# Patient Record
Sex: Male | Born: 1960 | Race: Black or African American | Hispanic: No | Marital: Married | Smoking: Never smoker
Health system: Southern US, Community
[De-identification: ages and names within clinical notes are randomized; demographics above are authoritative.]

## PROBLEM LIST (undated history)

## (undated) DIAGNOSIS — I10 Essential (primary) hypertension: Secondary | ICD-10-CM

## (undated) DIAGNOSIS — E785 Hyperlipidemia, unspecified: Secondary | ICD-10-CM

## (undated) HISTORY — DX: Essential (primary) hypertension: I10

## (undated) HISTORY — DX: Hyperlipidemia, unspecified: E78.5

---

## 2008-01-26 HISTORY — PX: KNEE SURGERY: SHX244

## 2008-04-06 ENCOUNTER — Ambulatory Visit: Payer: Self-pay | Admitting: Family Medicine

## 2008-06-13 ENCOUNTER — Ambulatory Visit: Payer: Self-pay | Admitting: Specialist

## 2008-08-28 ENCOUNTER — Emergency Department: Payer: Self-pay | Admitting: Emergency Medicine

## 2009-10-21 IMAGING — CT CT HEAD WITHOUT CONTRAST
2 series · 16 of 30 positions shown, 20 images · non-contrast
Comparison: none

REASON FOR EXAM: HA
COMMENTS:

[Series 2: without · axial · non-contrast · 0.45mm/px · z∈[+413,+538]mm · 13 of 31 slices shown, 17 images]
[im 3/31  brain]
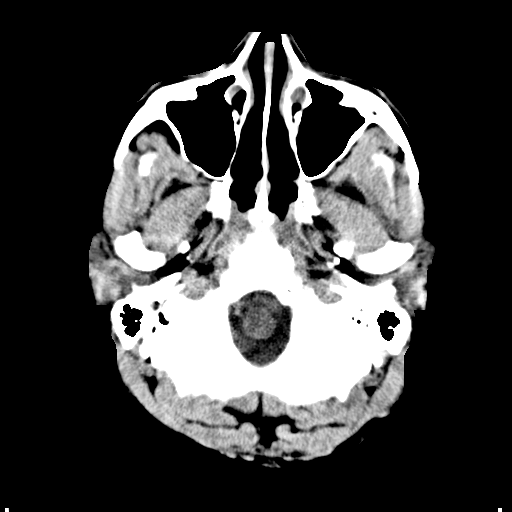
[im 3/31  bone]
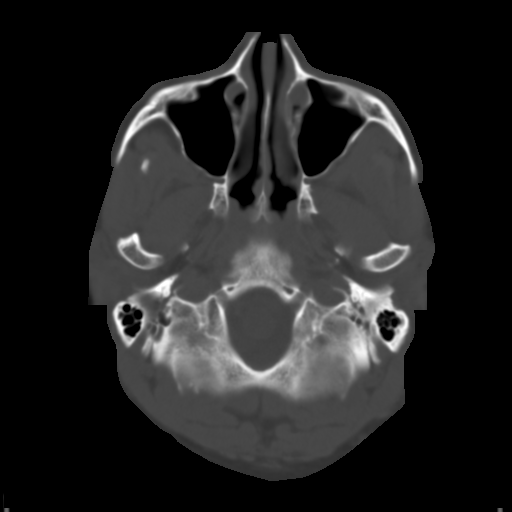
[im 5/31  brain]
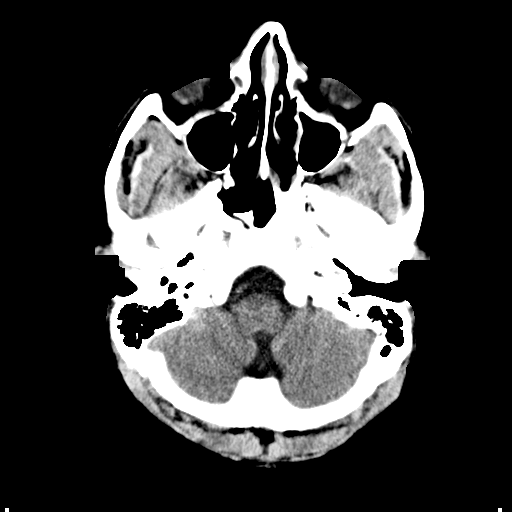
[im 7/31  brain]
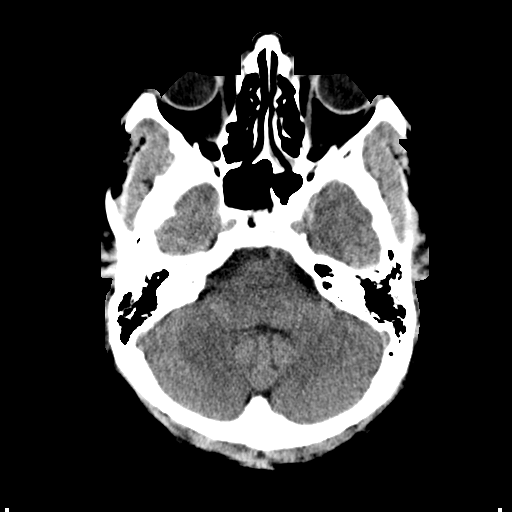
[im 9/31  brain]
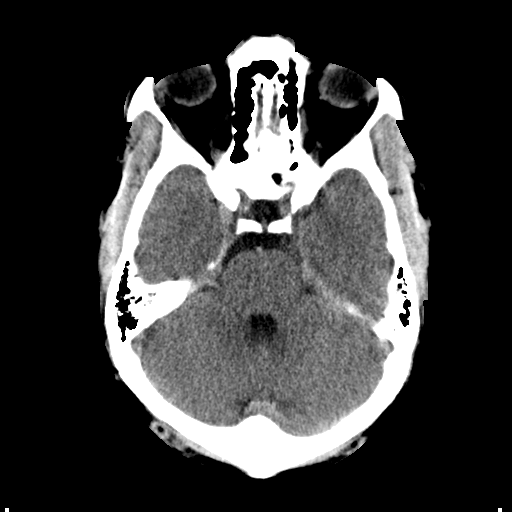
[im 11/31  brain]
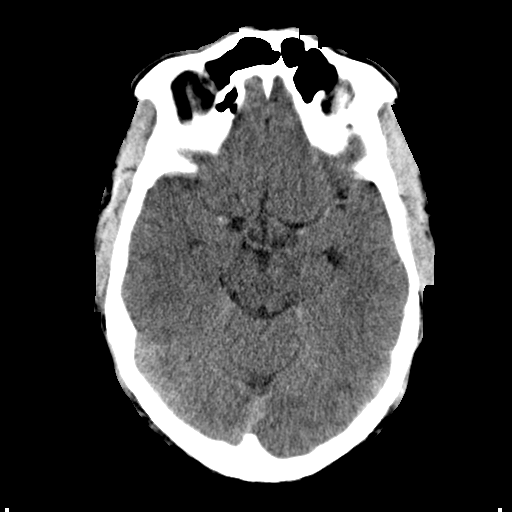
[im 11/31  bone]
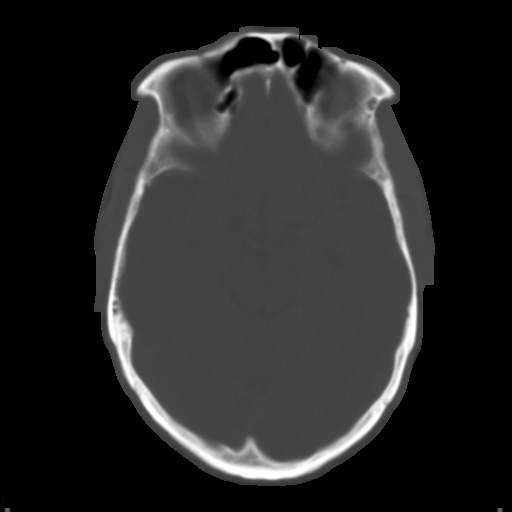
[im 13/31  brain]
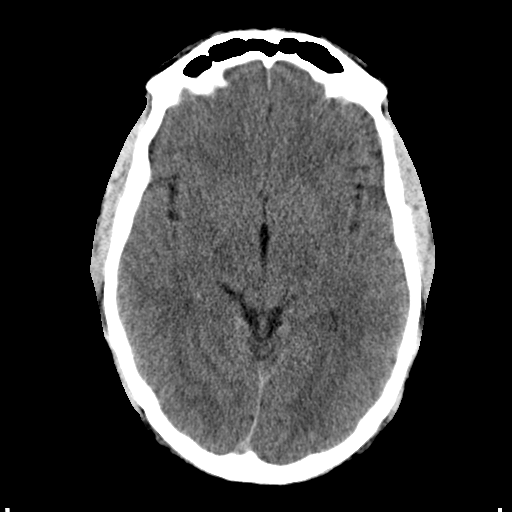
[im 16/31  brain]
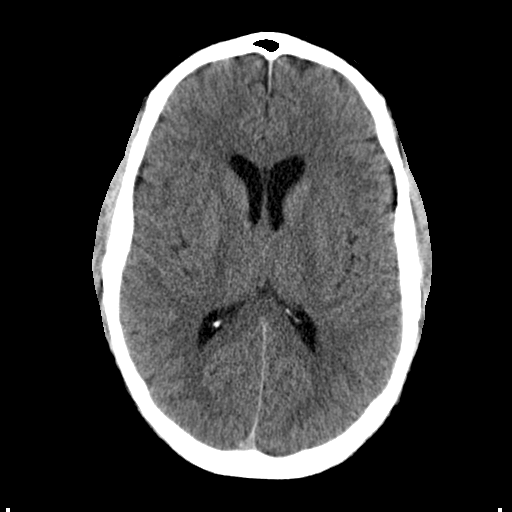
[im 18/31  brain]
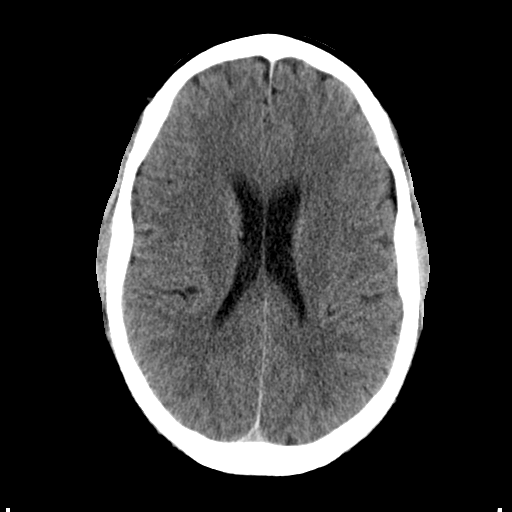
[im 20/31  brain]
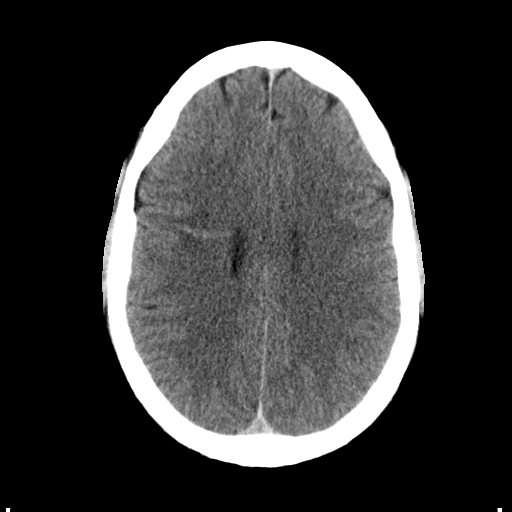
[im 20/31  bone]
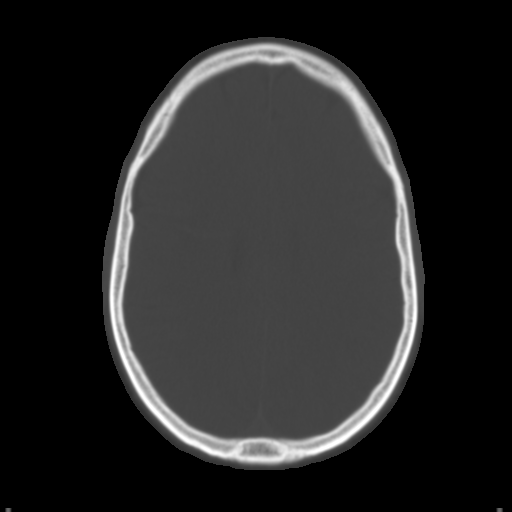
[im 22/31  brain]
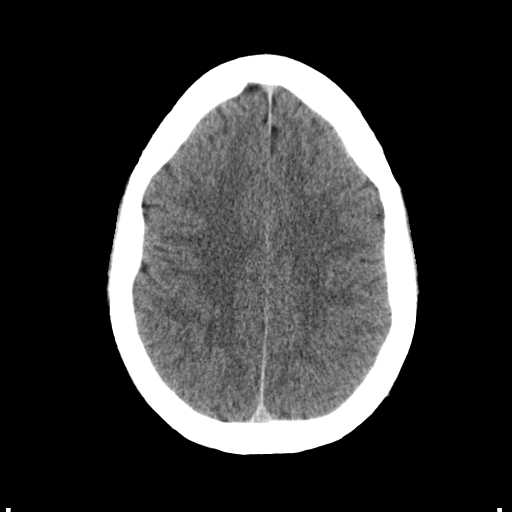
[im 24/31  brain]
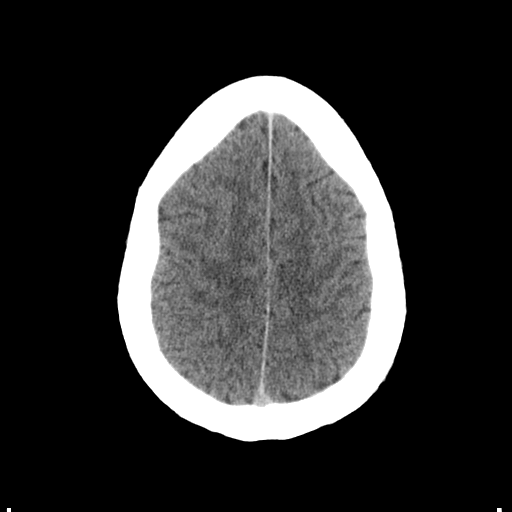
[im 26/31  brain]
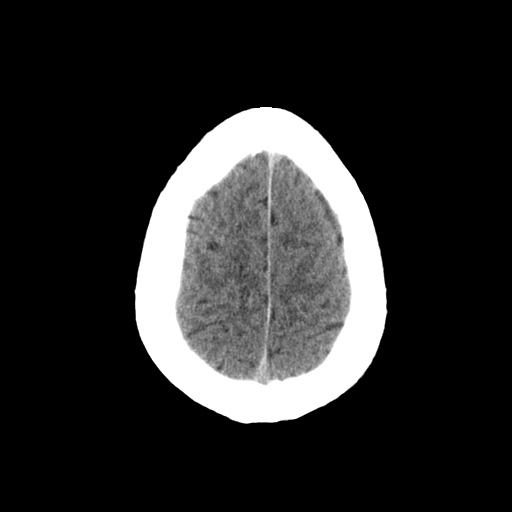
[im 28/31  brain]
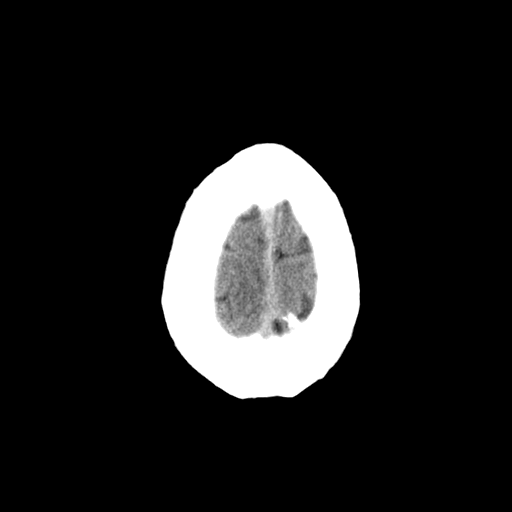
[im 28/31  bone]
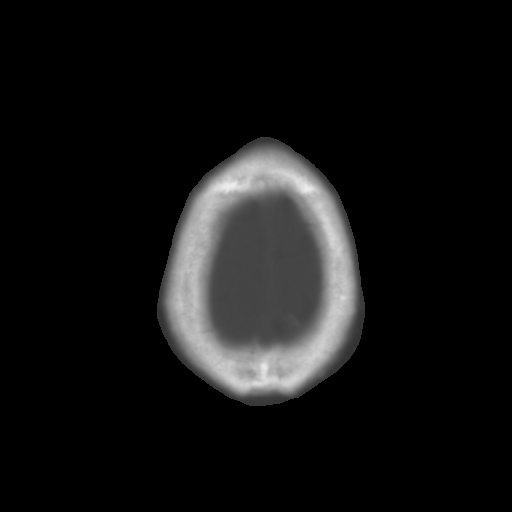

[Series 3: bone · axial · 0.45mm/px · z∈[+413,+453]mm · 3 of 31 slices shown]
[im 3/31  bone]
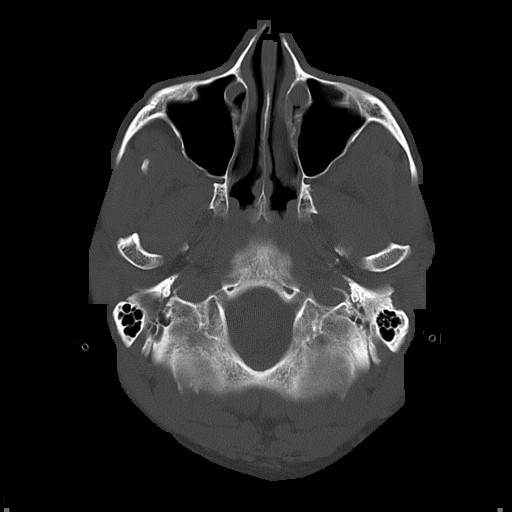
[im 7/31  bone]
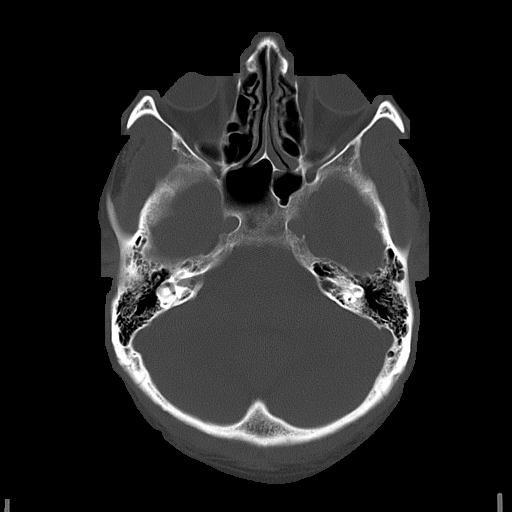
[im 11/31  bone]
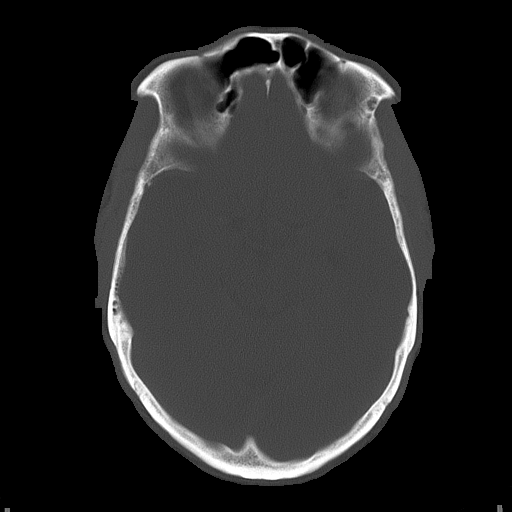

[16 of 30 positions shown; findings below may reference images not displayed]

PROCEDURE:     CT  - CT HEAD WITHOUT CONTRAST  - August 28, 2008  [DATE]

RESULT:     History: Headache.

Procedure and findings:Standard nonenhanced head CT obtained. No prior
studies available for comparison. No mass lesions noted, no hydrocephalus
noted. Linear hyperdensity noted in the right parietal white matter with
mild surrounding atrophy.This is most likely vascular malformation. Followup
MRI is suggested. This would be useful to evaluate for vascular malformation
and possible small hemorrhage. No bony abnormality identified.
IMPRESSION: Findings most consistent with a small vascular malformation
in the right parietal white matter. See discussion above. Report phoned to
patient's physician at time of study.

## 2011-11-17 ENCOUNTER — Ambulatory Visit: Payer: Self-pay | Admitting: General Surgery

## 2011-11-17 HISTORY — PX: COLONOSCOPY: SHX174

## 2014-07-17 ENCOUNTER — Ambulatory Visit: Payer: Self-pay | Admitting: Cardiology

## 2014-07-20 ENCOUNTER — Other Ambulatory Visit: Payer: Self-pay | Admitting: Family Medicine

## 2014-08-13 ENCOUNTER — Ambulatory Visit: Payer: Self-pay | Admitting: Family Medicine

## 2014-09-22 ENCOUNTER — Other Ambulatory Visit: Payer: Self-pay | Admitting: Family Medicine

## 2014-10-26 ENCOUNTER — Other Ambulatory Visit: Payer: Self-pay | Admitting: Family Medicine

## 2014-11-23 ENCOUNTER — Other Ambulatory Visit: Payer: Self-pay | Admitting: Family Medicine

## 2014-12-24 ENCOUNTER — Other Ambulatory Visit: Payer: Self-pay | Admitting: Family Medicine

## 2015-01-29 ENCOUNTER — Telehealth: Payer: Self-pay | Admitting: Family Medicine

## 2015-01-29 MED ORDER — SIMVASTATIN 20 MG PO TABS: 20.0000 mg | ORAL_TABLET | Freq: Every day | ORAL | Status: DC

## 2015-01-29 MED ORDER — AMLODIPINE BESYLATE 10 MG PO TABS: 10.0000 mg | ORAL_TABLET | Freq: Every day | ORAL | Status: DC

## 2015-01-29 MED ORDER — ATENOLOL 25 MG PO TABS: 25.0000 mg | ORAL_TABLET | Freq: Two times a day (BID) | ORAL | Status: DC

## 2015-01-29 NOTE — Telephone Encounter (Signed)
Pt needs refill on BP and Cholesterol to be [facility] [address]

## 2015-01-29 NOTE — Telephone Encounter (Signed)
Sent 30 day supply to pharmacy,please inform pt he must RTO for refills

## 2015-01-29 NOTE — Telephone Encounter (Signed)
Pt informed of refills and he scheduled an appt.

## 2015-02-04 ENCOUNTER — Encounter: Payer: Self-pay | Admitting: Family Medicine

## 2015-02-14 ENCOUNTER — Ambulatory Visit (INDEPENDENT_AMBULATORY_CARE_PROVIDER_SITE_OTHER): Payer: Managed Care, Other (non HMO) | Admitting: Family Medicine

## 2015-02-14 ENCOUNTER — Encounter: Payer: Self-pay | Admitting: Family Medicine

## 2015-02-14 VITALS — BP 118/72 | HR 83 | Temp 97.8°F | Resp 18 | Ht 74.0 in | Wt 285.0 lb

## 2015-02-14 DIAGNOSIS — E669 Obesity, unspecified: Secondary | ICD-10-CM | POA: Diagnosis not present

## 2015-02-14 DIAGNOSIS — I10 Essential (primary) hypertension: Secondary | ICD-10-CM | POA: Diagnosis not present

## 2015-02-14 DIAGNOSIS — E785 Hyperlipidemia, unspecified: Secondary | ICD-10-CM | POA: Insufficient documentation

## 2015-02-14 DIAGNOSIS — R739 Hyperglycemia, unspecified: Secondary | ICD-10-CM

## 2015-02-14 LAB — GLUCOSE, POCT (MANUAL RESULT ENTRY): POC GLUCOSE: 159 mg/dL — AB (ref 70–99)

## 2015-02-14 LAB — POCT GLYCOSYLATED HEMOGLOBIN (HGB A1C): Hemoglobin A1C: 6.2

## 2015-02-14 MED ORDER — SIMVASTATIN 20 MG PO TABS
20.0000 mg | ORAL_TABLET | Freq: Every day | ORAL | Status: DC
Start: 2015-02-14 — End: 2016-02-13

## 2015-02-14 MED ORDER — ATENOLOL 25 MG PO TABS: 25.0000 mg | ORAL_TABLET | Freq: Two times a day (BID) | ORAL | Status: DC

## 2015-02-14 MED ORDER — AMLODIPINE BESYLATE 10 MG PO TABS
10.0000 mg | ORAL_TABLET | Freq: Every day | ORAL | Status: DC
Start: 2015-02-14 — End: 2016-02-13

## 2015-02-14 MED ORDER — HYDROCHLOROTHIAZIDE 12.5 MG PO TABS: 12.5000 mg | ORAL_TABLET | Freq: Every day | ORAL | Status: DC

## 2015-02-14 NOTE — Progress Notes (Signed)
Name: Trevor Wise.   MRN: [id number]    DOB: 11-Oct-1960   Date:02/14/2015       Progress Note  Subjective  Chief Complaint  Chief Complaint  Patient presents with  . Hypertension     follow up  . Hyperlipidemia    HPI  Hypertension   Patient presents for follow-up of hypertension. It has been present for over 5 years.  Patient states that there is compliance with medical regimen which consists of amlodipine 10 mg daily at 12.5 hydrochlorothiazide daily and atenolol 25 mg daily. . There is no end organ disease. Cardiac risk factors include hypertension hyperlipidemia and diabetes.  Exercise regimen consist of cardio and weights .  Diet consist of typical American diet .  Hyperlipidemia  Patient has a history of hyperlipidemia for over 5 years.  Current medical regimen consist of simvastatin 20 mg daily at bedtime take his most good .  Compliance is good .  Diet and exercise are currently followed poorly .  Risk factors for cardiovascular disease include hyperlipidemia hypertension obesity .   There have been no side effects from the medication.    Obesity  Patient has a long-standing history of obesity. He has skipped his exercise over the last month and has not been eating as he has been performed with literature and a one time dietitian visit.  Hyperglycemia  History of elevated glucose 02/25/2005 with an A1c of 5.6 about 1 year ago. He has gained weight since that time.    Past Medical History  Diagnosis Date  . Hyperlipidemia   . Hypertension     Social History  Substance Use Topics  . Smoking status: Never Smoker   . Smokeless tobacco: Not on file  . Alcohol Use: No     Current outpatient prescriptions:  .  amLODipine (NORVASC) 10 MG tablet, Take 1 tablet (10 mg total) by mouth daily., Disp: 90 tablet, Rfl: 3 .  atenolol (TENORMIN) 25 MG tablet, Take 1 tablet (25 mg total) by mouth 2 (two) times daily., Disp: 180 tablet, Rfl: 3 .  hydrochlorothiazide  (HYDRODIURIL) 12.5 MG tablet, Take 1 tablet (12.5 mg total) by mouth daily., Disp: 90 tablet, Rfl: 3 .  simvastatin (ZOCOR) 20 MG tablet, Take 1 tablet (20 mg total) by mouth daily., Disp: 90 tablet, Rfl: 3  No Known Allergies  Review of Systems  Constitutional: Negative for fever, chills and weight loss.  HENT: Negative for congestion, hearing loss, sore throat and tinnitus.   Eyes: Negative for blurred vision, double vision and redness.  Respiratory: Negative for cough, hemoptysis and shortness of breath.   Cardiovascular: Negative for chest pain, palpitations, orthopnea, claudication and leg swelling.  Gastrointestinal: Negative for heartburn, nausea, vomiting, diarrhea, constipation and blood in stool.  Genitourinary: Negative for dysuria, urgency, frequency and hematuria.  Musculoskeletal: Positive for joint pain. Negative for myalgias, back pain, falls and neck pain.  Skin: Negative for itching.  Neurological: Negative for dizziness, tingling, tremors, focal weakness, seizures, loss of consciousness, weakness and headaches.  Endo/Heme/Allergies: Does not bruise/bleed easily.  Psychiatric/Behavioral: Negative for depression and substance abuse. The patient is not nervous/anxious and does not have insomnia.      Objective  Filed Vitals:   02/14/15 0732  BP: 118/72  Pulse: 83  Temp: 97.8 F (36.6 C)  TempSrc: Oral  Resp: 18  Height:  (1.88 m)  Weight: 285 lb (129.275 kg)  SpO2: 94%     Physical Exam  Constitutional: He is oriented  to person, place, and time and well-developed, well-nourished, and in no distress.  Obesity no acute distress  HENT:  Head: Normocephalic.  Eyes: EOM are normal. Pupils are equal, round, and reactive to light.  Neck: Normal range of motion. Neck supple. No thyromegaly present.  Cardiovascular: Normal rate, regular rhythm and normal heart sounds.   No murmur heard. Pulmonary/Chest: Effort normal and breath sounds normal. No respiratory  distress. He has no wheezes.  Abdominal: Soft. Bowel sounds are normal.  Musculoskeletal: Normal range of motion. He exhibits no edema.  Lymphadenopathy:    He has no cervical adenopathy.  Neurological: He is alert and oriented to person, place, and time. No cranial nerve deficit. Gait normal. Coordination normal.  Skin: Skin is warm and dry. No rash noted.  Psychiatric: Affect and judgment normal.      Assessment & Plan  1. Hyperglycemia Worsening referral to nutritionist - POCT HgB A1C - POCT Glucose (CBG)  2. Essential hypertension Well-controlled  3. Hyperlipidemia Well-controlled  4. Obesity Worsening referral encourage aerobic exercise - Ambulatory referral to Nutrition and Diabetic Education

## 2015-03-19 ENCOUNTER — Encounter: Payer: Managed Care, Other (non HMO) | Admitting: Family Medicine

## 2015-05-29 ENCOUNTER — Other Ambulatory Visit: Payer: Self-pay | Admitting: Family Medicine

## 2015-07-15 ENCOUNTER — Encounter: Payer: Managed Care, Other (non HMO) | Admitting: Family Medicine

## 2015-07-17 ENCOUNTER — Ambulatory Visit (INDEPENDENT_AMBULATORY_CARE_PROVIDER_SITE_OTHER): Payer: Managed Care, Other (non HMO) | Admitting: Family Medicine

## 2015-07-17 VITALS — BP 130/80 | HR 78 | Temp 98.6°F | Resp 18 | Ht 74.0 in | Wt 277.4 lb

## 2015-07-17 DIAGNOSIS — E785 Hyperlipidemia, unspecified: Secondary | ICD-10-CM

## 2015-07-17 DIAGNOSIS — I10 Essential (primary) hypertension: Secondary | ICD-10-CM | POA: Diagnosis not present

## 2015-07-17 DIAGNOSIS — E669 Obesity, unspecified: Secondary | ICD-10-CM | POA: Diagnosis not present

## 2015-07-17 DIAGNOSIS — R7303 Prediabetes: Secondary | ICD-10-CM | POA: Diagnosis not present

## 2015-07-17 LAB — CBC
HEMATOCRIT: 42.6 % (ref 38.5–50.0)
HEMOGLOBIN: 14.8 g/dL (ref 13.2–17.1)
MCH: 31.2 pg (ref 27.0–33.0)
MCHC: 34.7 g/dL (ref 32.0–36.0)
MCV: 89.7 fL (ref 80.0–100.0)
MPV: 9.5 fL (ref 7.5–12.5)
Platelets: 204 10*3/uL (ref 140–400)
RBC: 4.75 MIL/uL (ref 4.20–5.80)
RDW: 13.2 % (ref 11.0–15.0)
WBC: 5 10*3/uL (ref 3.8–10.8)

## 2015-07-17 LAB — LIPID PANEL
CHOL/HDL RATIO: 2.6 ratio (ref <=5.0)
Cholesterol: 157 mg/dL (ref 125–200)
HDL: 61 mg/dL (ref >=40)
LDL CALC: 75 mg/dL (ref <130)
TRIGLYCERIDES: 106 mg/dL (ref <150)
VLDL: 21 mg/dL (ref <30)

## 2015-07-17 LAB — COMPREHENSIVE METABOLIC PANEL
ALT: 24 U/L (ref 9–46)
AST: 29 U/L (ref 10–35)
Albumin: 4.1 g/dL (ref 3.6–5.1)
Alkaline Phosphatase: 72 U/L (ref 40–115)
BUN: 13 mg/dL (ref 7–25)
CHLORIDE: 101 mmol/L (ref 98–110)
CO2: 27 mmol/L (ref 20–31)
CREATININE: 0.99 mg/dL (ref 0.70–1.33)
Calcium: 9.1 mg/dL (ref 8.6–10.3)
Glucose, Bld: 93 mg/dL (ref 65–99)
POTASSIUM: 3.8 mmol/L (ref 3.5–5.3)
SODIUM: 137 mmol/L (ref 135–146)
Total Bilirubin: 0.5 mg/dL (ref 0.2–1.2)
Total Protein: 7.7 g/dL (ref 6.1–8.1)

## 2015-07-17 LAB — TSH: TSH: 0.68 mIU/L (ref 0.40–4.50)

## 2015-07-17 LAB — POCT GLYCOSYLATED HEMOGLOBIN (HGB A1C): Hemoglobin A1C: 6.2

## 2015-07-17 MED ORDER — ATENOLOL 25 MG PO TABS: 25.0000 mg | ORAL_TABLET | Freq: Every day | ORAL | Status: DC

## 2015-07-18 LAB — VITAMIN D 25 HYDROXY (VIT D DEFICIENCY, FRACTURES): Vit D, 25-Hydroxy: 33 ng/mL (ref 30–100)

## 2015-07-19 ENCOUNTER — Encounter: Payer: Self-pay | Admitting: Family Medicine

## 2015-07-19 NOTE — Progress Notes (Signed)
Date:  07/17/2015   Name:  Trevor FairyRobert L Salvia Jr.   DOB:  02-Feb-1960   MRN:  [id number]  PCP:  Trevor MascotLemont Morrisey, MD    Chief Complaint: Hypertension and Medication Refill   History of Present Illness:  This is a 55 y.o. male seen for five month f/u. HTN on amlodipine, atenolol and HCTZ, HLD on Zocor. Hx prediabetes, a1c 6.2% last visit. Weight down 8#, declines MNT.  Review of Systems:  Review of Systems  Constitutional: Negative for fever and fatigue.  Respiratory: Negative for cough and shortness of breath.   Cardiovascular: Negative for chest pain and leg swelling.  Endocrine: Negative for polyuria.  Genitourinary: Negative for difficulty urinating.  Neurological: Negative for syncope and light-headedness.    Patient Active Problem List   Diagnosis Date Noted  . Prediabetes 07/17/2015  . Essential hypertension 02/14/2015  . Hyperlipidemia 02/14/2015  . Obesity, Class II, BMI 35-39.9 02/14/2015    Prior to Admission medications   Medication Sig Start Date End Date Taking? Authorizing Provider  amLODipine (NORVASC) 10 MG tablet Take 1 tablet (10 mg total) by mouth daily. 02/14/15   Trevor MascotLemont Morrisey, MD  atenolol (TENORMIN) 25 MG tablet Take 1 tablet (25 mg total) by mouth daily. 07/17/15   Schuyler AmorWilliam Pierra Skora, MD  hydrochlorothiazide (HYDRODIURIL) 12.5 MG tablet Take 1 tablet (12.5 mg total) by mouth daily. 02/14/15   Trevor MascotLemont Morrisey, MD  simvastatin (ZOCOR) 20 MG tablet Take 1 tablet (20 mg total) by mouth daily. 02/14/15   Trevor MascotLemont Morrisey, MD    No Known Allergies  Past Surgical History  Procedure Laterality Date  . Knee surgery      Social History  Substance Use Topics  . Smoking status: Never Smoker   . Smokeless tobacco: Not on file  . Alcohol Use: No    No family history on file.  Medication list has been reviewed and updated.  Physical Examination: BP 130/80 mmHg  Pulse 78  Temp(Src) 98.6 F (37 C)  Resp 18  Ht 6\' 2"  (1.88 m)  Wt 277 lb 6 oz (125.816 kg)   BMI 35.60 kg/m2  SpO2 94%  Physical Exam  Constitutional: He appears well-developed and well-nourished.  Cardiovascular: Normal rate, regular rhythm and normal heart sounds.   Pulmonary/Chest: Effort normal and breath sounds normal.  Musculoskeletal: He exhibits no edema.  Neurological: He is alert.  Skin: Skin is warm and dry.  Psychiatric: He has a normal mood and affect. His behavior is normal.  Nursing note and vitals reviewed.   Assessment and Plan:  1. Essential hypertension Well controlled on three meds - Comprehensive metabolic panel - CBC  2. Hyperlipidemia Unclear control on Zocor  3. Prediabetes A1c 6.2% today, stable - Lipid Profile - POCT HgB A1C  4. Obesity, Class II, BMI 35-39.9 Discussed exercise/weight loss, declines MNT - TSH - Vitamin D (25 hydroxy)  Return in about 6 months (around 01/16/2016).  Dionne AnoWilliam M. Kingsley SpittlePlonk, Jr. MD [facility]  07/19/2015

## 2015-08-19 ENCOUNTER — Ambulatory Visit: Payer: Managed Care, Other (non HMO) | Admitting: Family Medicine

## 2016-02-13 ENCOUNTER — Other Ambulatory Visit: Payer: Self-pay | Admitting: Family Medicine

## 2016-02-13 ENCOUNTER — Telehealth: Payer: Self-pay

## 2016-02-13 MED ORDER — SIMVASTATIN 20 MG PO TABS
20.0000 mg | ORAL_TABLET | Freq: Every day | ORAL | 0 refills | Status: DC
Start: 2016-02-13 — End: 2016-03-03

## 2016-02-13 MED ORDER — ATENOLOL 25 MG PO TABS: 25.0000 mg | ORAL_TABLET | Freq: Every day | ORAL | 0 refills | Status: DC

## 2016-02-13 MED ORDER — AMLODIPINE BESYLATE 10 MG PO TABS: 10.0000 mg | ORAL_TABLET | Freq: Every day | ORAL | 0 refills | Status: DC

## 2016-02-13 MED ORDER — HYDROCHLOROTHIAZIDE 12.5 MG PO TABS: 12.5000 mg | ORAL_TABLET | Freq: Every day | ORAL | 0 refills | Status: DC

## 2016-02-13 NOTE — Telephone Encounter (Signed)
Pt verbally informed. °

## 2016-02-13 NOTE — Telephone Encounter (Signed)
Sent 30 days only

## 2016-02-13 NOTE — Telephone Encounter (Signed)
Pt scheduled appointment for 03/03/16 and would like if you could give him enough to last until then

## 2016-02-13 NOTE — Telephone Encounter (Signed)
LFT MESSAGE THAT WE NEED HIM TO CALL AND GET APPT  FOR HIS MEDCIATION REFILL SINCE HAS NOT BEEN SEEN EXCEPT ONCE WITH A DR THAT WAS ONLY HERE A FEW DAYS.

## 2016-02-13 NOTE — Telephone Encounter (Signed)
Patient requesting [facility]ill of [facility]odipine, HCTZ[facility]

## 2016-03-03 ENCOUNTER — Ambulatory Visit (INDEPENDENT_AMBULATORY_CARE_PROVIDER_SITE_OTHER): Payer: Managed Care, Other (non HMO) | Admitting: Family Medicine

## 2016-03-03 ENCOUNTER — Encounter: Payer: Self-pay | Admitting: Family Medicine

## 2016-03-03 VITALS — BP 122/80 | HR 80 | Temp 98.2°F | Resp 16 | Ht 73.0 in | Wt 295.3 lb

## 2016-03-03 DIAGNOSIS — I10 Essential (primary) hypertension: Secondary | ICD-10-CM | POA: Diagnosis not present

## 2016-03-03 DIAGNOSIS — E669 Obesity, unspecified: Secondary | ICD-10-CM | POA: Diagnosis not present

## 2016-03-03 DIAGNOSIS — E7800 Pure hypercholesterolemia, unspecified: Secondary | ICD-10-CM | POA: Diagnosis not present

## 2016-03-03 DIAGNOSIS — R7303 Prediabetes: Secondary | ICD-10-CM

## 2016-03-03 DIAGNOSIS — E8881 Metabolic syndrome: Secondary | ICD-10-CM

## 2016-03-03 MED ORDER — DULAGLUTIDE 1.5 MG/0.5ML ~~LOC~~ SOAJ: 1.5000 mg | SUBCUTANEOUS | 5 refills | Status: DC

## 2016-03-03 MED ORDER — AMLODIPINE-VALSARTAN-HCTZ 10-160-12.5 MG PO TABS
1.0000 | ORAL_TABLET | Freq: Every day | ORAL | 1 refills | Status: DC
Start: 2016-03-03 — End: 2016-07-09

## 2016-03-03 MED ORDER — SIMVASTATIN 20 MG PO TABS: 20.0000 mg | ORAL_TABLET | Freq: Every day | ORAL | 1 refills | Status: DC

## 2016-03-03 NOTE — Progress Notes (Signed)
Name: Trevor Wise.   MRN: [id number]    DOB: 05-Jul-1960   Date:03/03/2016       Progress Note  Subjective  Chief Complaint  Chief Complaint  Patient presents with  . Follow-up  . Medication Refill    HPI  HTN: he is taking HCTZ, Norvasc and Atenolol for many years, bp is at goal, however discussed simplifying his regiment and he would like to try Exforge HCTZ. No chest pain, SOB, no decreased in exercise tolerance. He goes to the gym daily, treadmill, bike, mild weight lifting.   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   Hyperlipidemia: he is taking Simvastatin daily and denies myalgias.   Pre-diabetes: he denies polyphagia, polyuria or polydipsia. Positive family history of DM  Patient Active Problem List   Diagnosis Date Noted  . Prediabetes 07/17/2015  . Essential hypertension 02/14/2015  . Hyperlipidemia 02/14/2015  . Obesity, Class II, BMI 35-39.9 02/14/2015    Past Surgical History:  Procedure Laterality Date  . COLONOSCOPY  11/17/2011  . KNEE SURGERY Left 2010    Family History  Problem Relation Age of Onset  . Diabetes Mother   . Kidney disease Mother   . Cancer Mother     multiple myeloma  . Hypertension Father   . Obesity Sister     Social History   Social History  . Marital status: Married    Spouse name: N/A  . Number of children: N/A  . Years of education: N/A   Occupational History  . Not on file.   Social History Main Topics  . Smoking status: Never Smoker  . Smokeless tobacco: Never Used  . Alcohol use 0.6 oz/week    1 Cans of beer per week     Comment: occasionally  . Drug use: No  . Sexual activity: Yes    Partners: Female   Other Topics Concern  . Not on file   Social History Narrative  . No narrative on file     Current Outpatient Prescriptions:   .  aspirin EC 81 MG tablet, Take 81 mg by mouth daily., Disp: , Rfl:  .  Multiple Vitamins-Minerals (MENS 50+ MULTI VITAMIN/MIN) TABS, Take by mouth., Disp: , Rfl:  .  simvastatin (ZOCOR) 20 MG tablet, Take 1 tablet (20 mg total) by mouth daily., Disp: 90 tablet, Rfl: 1 .  Amlodipine-Valsartan-HCTZ 10-160-12.5 MG TABS, Take 1 tablet by mouth daily., Disp: 90 tablet, Rfl: 1 .  Dulaglutide (TRULICITY) 1.5 XA/1.2IN SOPN, Inject 1.5 mg into the skin once a week., Disp: 4 pen, Rfl: 5  No Known Allergies   ROS  Constitutional: Negative for fever positive  weight change.  Respiratory: Negative for cough and shortness of breath.   Cardiovascular: Negative for chest pain or palpitations.  Gastrointestinal: Negative for abdominal pain, no bowel changes.  Musculoskeletal: Negative for gait problem or joint swelling.  Skin: Negative for rash.  Neurological: Negative for dizziness or headache.  No other specific complaints in a complete review of systems (except as listed in HPI above).  Objective  Vitals:   03/03/16 0745  BP: 122/80  Pulse: 80  Resp: 16  Temp: 98.2 F (36.8 C)  TempSrc: Oral  SpO2: 96%  Weight: 295 lb 4.8 oz (133.9 kg)  Height: _0  (1.854 m)  Body mass index is 38.96 kg/m.  Physical Exam  Constitutional: Patient appears well-developed and well-nourished. Obese  No distress.  HEENT: head atraumatic, normocephalic, pupils equal and reactive to light, neck supple, throat within normal limits Cardiovascular: Normal rate, regular rhythm and normal heart sounds.  No murmur heard. Trace  BLE edema. Pulmonary/Chest: Effort normal and breath sounds normal. No respiratory distress. Abdominal: Soft.  There is no tenderness. Psychiatric: Patient has a normal mood and affect. behavior is normal. Judgment and thought content normal.  PHQ2/9: Depression screen [facility] 2/9 03/03/2016 03/03/2016 02/14/2015  Decreased Interest 0 0 0  Down, Depressed, Hopeless 0 0 0  PHQ - 2 Score 0  0 0    Fall Risk: Fall Risk  03/03/2016 03/03/2016 02/14/2015  Falls in the past year? No No No     Functional Status Survey: Is the patient deaf or have difficulty hearing?: No Does the patient have difficulty seeing, even when wearing glasses/contacts?: No Does the patient have difficulty concentrating, remembering, or making decisions?: No Does the patient have difficulty walking or climbing stairs?: No Does the patient have difficulty dressing or bathing?: No Does the patient have difficulty doing errands alone such as visiting a doctor's office or shopping?: No   Assessment & Plan  1. Essential hypertension  - Amlodipine-Valsartan-HCTZ 10-160-12.5 MG TABS; Take 1 tablet by mouth daily.  Dispense: 90 tablet; Refill: 1  2. Pure hypercholesterolemia  - simvastatin (ZOCOR) 20 MG tablet; Take 1 tablet (20 mg total) by mouth daily.  Dispense: 90 tablet; Refill: 1  3. Prediabetes  Discussed possible side effects, no family history of thyroid cancer - Dulaglutide (TRULICITY) 1.5 GN/3.5QN SOPN; Inject 1.5 mg into the skin once a week.  Dispense: 4 pen; Refill: 5  4. Obesity, Class II, BMI 35-39.9  - Dulaglutide (TRULICITY) 1.5 ET/4.8WS SOPN; Inject 1.5 mg into the skin once a week.  Dispense: 4 pen; Refill: 5  5. Insulin resistance  - Dulaglutide (TRULICITY) 1.5 BB/7.9FF SOPN; Inject 1.5 mg into the skin once a week.  Dispense: 4 pen; Refill: 5

## 2016-03-13 ENCOUNTER — Other Ambulatory Visit: Payer: Self-pay | Admitting: Family Medicine

## 2016-03-17 ENCOUNTER — Ambulatory Visit: Payer: Managed Care, Other (non HMO) | Admitting: Family Medicine

## 2016-04-09 ENCOUNTER — Encounter: Payer: Self-pay | Admitting: Physician Assistant

## 2016-04-09 ENCOUNTER — Ambulatory Visit: Payer: Self-pay | Admitting: Physician Assistant

## 2016-04-09 VITALS — BP 130/80 | HR 78 | Temp 97.8°F | Ht 73.0 in | Wt 274.0 lb

## 2016-04-09 DIAGNOSIS — Z0000 Encounter for general adult medical examination without abnormal findings: Secondary | ICD-10-CM

## 2016-04-09 DIAGNOSIS — Z008 Encounter for other general examination: Secondary | ICD-10-CM

## 2016-04-09 DIAGNOSIS — I10 Essential (primary) hypertension: Secondary | ICD-10-CM

## 2016-04-09 DIAGNOSIS — Z0189 Encounter for other specified special examinations: Secondary | ICD-10-CM

## 2016-04-09 DIAGNOSIS — E7849 Other hyperlipidemia: Secondary | ICD-10-CM

## 2016-04-09 NOTE — Progress Notes (Signed)
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

## 2016-04-10 LAB — CMP12+LP+TP+TSH+6AC+PSA+CBC…
A/G RATIO: 1.5 (ref 1.2–2.2)
ALK PHOS: 72 IU/L (ref 39–117)
ALT: 32 IU/L (ref 0–44)
AST: 40 IU/L (ref 0–40)
Albumin: 4.4 g/dL (ref 3.5–5.5)
BASOS: 1 %
BILIRUBIN TOTAL: 0.5 mg/dL (ref 0.0–1.2)
BUN/Creatinine Ratio: 13 (ref 9–20)
BUN: 12 mg/dL (ref 6–24)
Basophils Absolute: 0 10*3/uL (ref 0.0–0.2)
CHOL/HDL RATIO: 3.4 ratio (ref 0.0–5.0)
CREATININE: 0.95 mg/dL (ref 0.76–1.27)
Calcium: 9.2 mg/dL (ref 8.7–10.2)
Chloride: 100 mmol/L (ref 96–106)
Cholesterol, Total: 174 mg/dL (ref 100–199)
EOS (ABSOLUTE): 0.1 10*3/uL (ref 0.0–0.4)
Eos: 3 %
Estimated CHD Risk: 0.5 times avg. (ref 0.0–1.0)
Free Thyroxine Index: 2.1 (ref 1.2–4.9)
GFR calc non Af Amer: 90 mL/min/{1.73_m2} (ref >59)
GFR, EST AFRICAN AMERICAN: 104 mL/min/{1.73_m2} (ref >59)
GGT: 35 IU/L (ref 0–65)
GLUCOSE: 89 mg/dL (ref 65–99)
Globulin, Total: 3 g/dL (ref 1.5–4.5)
HDL: 51 mg/dL (ref >39)
HEMATOCRIT: 39.8 % (ref 37.5–51.0)
Hemoglobin: 13.8 g/dL (ref 13.0–17.7)
IMMATURE GRANS (ABS): 0 10*3/uL (ref 0.0–0.1)
IMMATURE GRANULOCYTES: 0 %
Iron: 81 ug/dL (ref 38–169)
LDH: 196 IU/L (ref 121–224)
LDL CALC: 106 mg/dL — AB (ref 0–99)
LYMPHS: 47 %
Lymphocytes Absolute: 1.8 10*3/uL (ref 0.7–3.1)
MCH: 31 pg (ref 26.6–33.0)
MCHC: 34.7 g/dL (ref 31.5–35.7)
MCV: 89 fL (ref 79–97)
MONOCYTES: 8 %
MONOS ABS: 0.3 10*3/uL (ref 0.1–0.9)
NEUTROS PCT: 41 %
Neutrophils Absolute: 1.6 10*3/uL (ref 1.4–7.0)
PHOSPHORUS: 2.5 mg/dL (ref 2.5–4.5)
PROSTATE SPECIFIC AG, SERUM: 1.3 ng/mL (ref 0.0–4.0)
Platelets: 207 10*3/uL (ref 150–379)
Potassium: 4 mmol/L (ref 3.5–5.2)
RBC: 4.45 x10E6/uL (ref 4.14–5.80)
RDW: 13.7 % (ref 12.3–15.4)
SODIUM: 138 mmol/L (ref 134–144)
T3 Uptake Ratio: 32 % (ref 24–39)
T4 TOTAL: 6.5 ug/dL (ref 4.5–12.0)
TSH: 0.545 u[IU]/mL (ref 0.450–4.500)
Total Protein: 7.4 g/dL (ref 6.0–8.5)
Triglycerides: 85 mg/dL (ref 0–149)
URIC ACID: 6.2 mg/dL (ref 3.7–8.6)
VLDL CHOLESTEROL CAL: 17 mg/dL (ref 5–40)
WBC: 3.8 10*3/uL (ref 3.4–10.8)

## 2016-04-10 LAB — HCV COMMENT:

## 2016-04-10 LAB — VITAMIN D 25 HYDROXY (VIT D DEFICIENCY, FRACTURES): Vit D, 25-Hydroxy: 31.3 ng/mL (ref 30.0–100.0)

## 2016-04-10 LAB — HEPATITIS C ANTIBODY (REFLEX): HCV Ab: 0.1 s/co ratio (ref 0.0–0.9)

## 2016-04-10 LAB — HIV ANTIBODY (ROUTINE TESTING W REFLEX): HIV SCREEN 4TH GENERATION: NONREACTIVE

## 2016-06-04 ENCOUNTER — Ambulatory Visit: Payer: Managed Care, Other (non HMO) | Admitting: Family Medicine

## 2016-07-09 ENCOUNTER — Ambulatory Visit (INDEPENDENT_AMBULATORY_CARE_PROVIDER_SITE_OTHER): Payer: Managed Care, Other (non HMO) | Admitting: Family Medicine

## 2016-07-09 VITALS — BP 132/62 | HR 90 | Temp 98.0°F | Resp 16 | Ht 73.0 in | Wt 266.5 lb

## 2016-07-09 DIAGNOSIS — Z87898 Personal history of other specified conditions: Secondary | ICD-10-CM | POA: Diagnosis not present

## 2016-07-09 DIAGNOSIS — R739 Hyperglycemia, unspecified: Secondary | ICD-10-CM

## 2016-07-09 DIAGNOSIS — I10 Essential (primary) hypertension: Secondary | ICD-10-CM

## 2016-07-09 DIAGNOSIS — E669 Obesity, unspecified: Secondary | ICD-10-CM

## 2016-07-09 DIAGNOSIS — E7800 Pure hypercholesterolemia, unspecified: Secondary | ICD-10-CM | POA: Diagnosis not present

## 2016-07-09 DIAGNOSIS — R7303 Prediabetes: Secondary | ICD-10-CM

## 2016-07-09 MED ORDER — SCOPOLAMINE 1 MG/3DAYS TD PT72: 1.0000 | MEDICATED_PATCH | TRANSDERMAL | 0 refills | Status: DC

## 2016-07-09 MED ORDER — AMLODIPINE-VALSARTAN-HCTZ 10-160-12.5 MG PO TABS: 1.0000 | ORAL_TABLET | Freq: Every day | ORAL | 1 refills | Status: DC

## 2016-07-09 MED ORDER — SIMVASTATIN 20 MG PO TABS: 20.0000 mg | ORAL_TABLET | Freq: Every day | ORAL | 1 refills | Status: DC

## 2016-07-09 NOTE — Progress Notes (Signed)
Name: Trevor Wise.   MRN: [id number]    DOB: 02-20-1960   Date:07/09/2016       Progress Note  Subjective  Chief Complaint  Chief Complaint  Patient presents with  . Hypertension    3 month follow up  . Hyperlipidemia  . Obesity    stopped taking trulicity did not like how it made him feel  . needs meds for cruise for nausea    HPI  HTN: he is taking Exforge HCTZ, he is very compliant. No chest pain, SOB, no decreased in exercise tolerance. He goes to the gym daily, treadmill, bike, mild weight lifting.   Obesity: he tried Trulicity but after the first few weeks he started to get nauseated for the first few days after shot, he stopped mediation, he still has polyphagia, but is having healthier snacks, he has lost 8 lbs since last visit.   Hyperlipidemia: he is not  taking Simvastatin, states rx was not filled, last LDL done by employer showed increased in LDL level, he is willing to resume medication   Pre-diabetes: he has polyphagia, polyuria or polydipsia. Positive family history of DM, we will recheck labs   Patient Active Problem List   Diagnosis Date Noted  . Prediabetes 07/17/2015  . Essential hypertension 02/14/2015  . Hyperlipidemia 02/14/2015  . Obesity, Class II, BMI 35-39.9 02/14/2015    Past Surgical History:  Procedure Laterality Date  . COLONOSCOPY  11/17/2011  . KNEE SURGERY Left 2010    Family History  Problem Relation Age of Onset  . Diabetes Mother   . Kidney disease Mother   . Cancer Mother        multiple myeloma  . Hypertension Father   . Obesity Sister     Social History   Social History  . Marital status: Married    Spouse name: N/A  . Number of children: N/A  . Years of education: N/A   Occupational History  . Not on file.   Social History Main Topics  . Smoking status: Never Smoker  . Smokeless tobacco: Never Used  . Alcohol use 0.6 oz/week    1 Cans of beer per week     Comment: occasionally  . Drug use: No  .  Sexual activity: Yes    Partners: Female   Other Topics Concern  . Not on file   Social History Narrative  . No narrative on file     Current Outpatient Prescriptions:  .  Amlodipine-Valsartan-HCTZ 10-160-12.5 MG TABS, Take 1 tablet by mouth daily., Disp: 90 tablet, Rfl: 1 .  aspirin EC 81 MG tablet, Take 81 mg by mouth daily., Disp: , Rfl:  .  Multiple Vitamins-Minerals (MENS 50+ MULTI VITAMIN/MIN) TABS, Take by mouth., Disp: , Rfl:  .  simvastatin (ZOCOR) 20 MG tablet, Take 1 tablet (20 mg total) by mouth daily., Disp: 90 tablet, Rfl: 1  No Known Allergies   ROS  Constitutional: Negative for fever, positive for  weight change.  Respiratory: Negative for cough and shortness of breath.   Cardiovascular: Negative for chest pain or palpitations.  Gastrointestinal: Negative for abdominal pain, no bowel changes.  Musculoskeletal: Negative for gait problem or joint swelling.  Skin: Negative for rash.  Neurological: Negative for dizziness or headache.  No other specific complaints in a complete review of systems (except as listed in HPI above).  Objective  Vitals:   07/09/16 1539  BP: 132/62  Pulse: 90  Resp: 16  Temp: 98 F (36.7  C)  SpO2: 95%  Weight: 266 lb 8 oz (120.9 kg)  Height: _0  (1.854 m)    Body mass index is 35.16 kg/m.  Physical Exam  Constitutional: Patient appears well-developed and muscular . Obese  No distress.  HEENT: head atraumatic, normocephalic, pupils equal and reactive to light,neck supple, throat within normal limits Cardiovascular: Normal rate, regular rhythm and normal heart sounds.  No murmur heard. No BLE edema. Pulmonary/Chest: Effort normal and breath sounds normal. No respiratory distress. Abdominal: Soft.  There is no tenderness. Psychiatric: Patient has a normal mood and affect. behavior is normal. Judgment and thought content normal.   PHQ2/9: Depression screen [facility] 2/9 03/03/2016 03/03/2016 02/14/2015  Decreased Interest 0 0 0   Down, Depressed, Hopeless 0 0 0  PHQ - 2 Score 0 0 0     Fall Risk: Fall Risk  03/03/2016 03/03/2016 02/14/2015  Falls in the past year? No No No    Assessment & Plan  1. Pure hypercholesterolemia  - simvastatin (ZOCOR) 20 MG tablet; Take 1 tablet (20 mg total) by mouth daily.  Dispense: 90 tablet; Refill: 1  2. Essential hypertension  - Amlodipine-Valsartan-HCTZ 10-160-12.5 MG TABS; Take 1 tablet by mouth daily.  Dispense: 90 tablet; Refill: 1  3. Hyperglycemia  - Hemoglobin A1c  4. Pre-diabetes  - Hemoglobin A1c   5. Obesity, Class II, BMI 35-39.9  He is on life style modification  6. H/O motion sickness  - scopolamine (TRANSDERM-SCOP) 1 MG/3DAYS; Place 1 patch (1.5 mg total) onto the skin every 3 (three) days. First patch 4 hours prior to boarding the cruise  Dispense: 3 patch; Refill: 0

## 2016-12-28 ENCOUNTER — Encounter: Payer: Self-pay | Admitting: Physician Assistant

## 2016-12-28 ENCOUNTER — Ambulatory Visit: Payer: Self-pay | Admitting: Physician Assistant

## 2016-12-28 VITALS — BP 140/80 | HR 80 | Temp 98.5°F | Resp 16

## 2016-12-28 DIAGNOSIS — J069 Acute upper respiratory infection, unspecified: Secondary | ICD-10-CM

## 2016-12-28 MED ORDER — PSEUDOEPH-BROMPHEN-DM 30-2-10 MG/5ML PO SYRP: 5.0000 mL | ORAL_SOLUTION | Freq: Four times a day (QID) | ORAL | 0 refills | Status: DC | PRN

## 2016-12-28 MED ORDER — METHYLPREDNISOLONE 4 MG PO TBPK: ORAL_TABLET | ORAL | 0 refills | Status: DC

## 2016-12-28 NOTE — Progress Notes (Signed)
   Subjective: Cough     Patient ID: Trevor Fairyobert L Picardi Jr., male    DOB: 1960/12/19, 56 y.o.   MRN: [id number]  HPI Patient complain of intermittent cough for 2 weeks. Patient will coughing spell causing have bloody sputum which resolved. Patient denies fever/chills. Patient denies nausea vomiting diarrhea. Patient cough is nonproductive. Patient stated no relief over-the-counter preparations. Patient state took leftover prednisone last night and felt better this morning.   Review of Systems    hypertension Objective:   Physical Exam HEENT remarkable for edematous nasal terms clear rhinorrhea. Postnasal drainage. Neck is supple without adenopathy. Lungs clear to auscultation. Nonproductive cough. Heart is regular rate and rhythm.       Assessment & Plan: Cough secondary to viral upper rest or infection   Patient given discharge care instructions. Patient given a prescription for Bromfed-DM a Medrol Dosepak. Patient advised to follow-up with PCP if no improvement within 3-5 days.

## 2017-01-03 ENCOUNTER — Other Ambulatory Visit: Payer: Self-pay | Admitting: Family Medicine

## 2017-01-03 DIAGNOSIS — E7800 Pure hypercholesterolemia, unspecified: Secondary | ICD-10-CM

## 2017-01-12 ENCOUNTER — Encounter: Payer: Self-pay | Admitting: Family Medicine

## 2017-01-12 ENCOUNTER — Ambulatory Visit (INDEPENDENT_AMBULATORY_CARE_PROVIDER_SITE_OTHER): Payer: Managed Care, Other (non HMO) | Admitting: Family Medicine

## 2017-01-12 VITALS — BP 120/80 | HR 92 | Resp 14 | Ht 73.0 in | Wt 278.0 lb

## 2017-01-12 DIAGNOSIS — R059 Cough, unspecified: Secondary | ICD-10-CM

## 2017-01-12 DIAGNOSIS — E7800 Pure hypercholesterolemia, unspecified: Secondary | ICD-10-CM

## 2017-01-12 DIAGNOSIS — R05 Cough: Secondary | ICD-10-CM | POA: Diagnosis not present

## 2017-01-12 DIAGNOSIS — E669 Obesity, unspecified: Secondary | ICD-10-CM

## 2017-01-12 DIAGNOSIS — I10 Essential (primary) hypertension: Secondary | ICD-10-CM

## 2017-01-12 DIAGNOSIS — R7303 Prediabetes: Secondary | ICD-10-CM | POA: Diagnosis not present

## 2017-01-12 LAB — POCT GLYCOSYLATED HEMOGLOBIN (HGB A1C): Hemoglobin A1C: 6

## 2017-01-12 MED ORDER — SIMVASTATIN 20 MG PO TABS: 20.0000 mg | ORAL_TABLET | Freq: Every day | ORAL | 1 refills | Status: AC

## 2017-01-12 MED ORDER — AMLODIPINE-VALSARTAN-HCTZ 10-160-12.5 MG PO TABS: 1.0000 | ORAL_TABLET | Freq: Every day | ORAL | 1 refills | Status: AC

## 2017-01-12 MED ORDER — BENZONATATE 100 MG PO CAPS: 100.0000 mg | ORAL_CAPSULE | Freq: Three times a day (TID) | ORAL | 0 refills | Status: DC | PRN

## 2017-01-12 NOTE — Progress Notes (Signed)
Name: Trevor Wise.   MRN: [id number]    DOB: [birth year]   Date:01/12/2017       Progress Note  Subjective  Chief Complaint  Chief Complaint  Patient presents with  . Hypertension  . Hyperlipidemia  . Prediabetes    HPI    HTN: he is taking Exforge HCTZ, he is very compliant. No chest pain, SOB, no decreased in exercise tolerance. He goes to the gym daily, treadmill, bike, mild weight lifting. He eats a good breakfast and lunch but eats anything after that.   Obesity: he tried Trulicity but after the first few weeks he started to get nauseated for the first few days after shot, he stopped mediation, he still has polyphagia, he is eating healthier for breakfast and lunch ( usually salads) but eats anything after that. Drinking more water, avoid sweet beverages. He has gained weight 11 lbs since last visit. He likes to eat.   Hyperlipidemia: he is compliant with simvastatin again. He denies myalgia  Pre-diabetes: he has polyphagia, denies polyuria or polydipsia. Positive family history of DM, hgbA1C is down to 6.0%, he will have labs through his job in January and we will check random insulin    Patient Active Problem List   Diagnosis Date Noted  . Prediabetes 07/17/2015  . Essential hypertension 02/14/2015  . Hyperlipidemia 02/14/2015  . Obesity, Class II, BMI 35-39.9 02/14/2015    Past Surgical History:  Procedure Laterality Date  . COLONOSCOPY  11/17/2011  . KNEE SURGERY Left 2010    Family History  Problem Relation Age of Onset  . Diabetes Mother   . Kidney disease Mother   . Cancer Mother        multiple myeloma  . Hypertension Father   . Obesity Sister     Social History   Socioeconomic History  . Marital status: Married    Spouse name: Not on file  . Number of children: Not on file  . Years of education: Not on file  . Highest education level: Not on file  Social Needs  . Financial resource strain: Not on file  . Food insecurity - worry: Not  on file  . Food insecurity - inability: Not on file  . Transportation needs - medical: Not on file  . Transportation needs - non-medical: Not on file  Occupational History  . Not on file  Tobacco Use  . Smoking status: Never Smoker  . Smokeless tobacco: Never Used  Substance and Sexual Activity  . Alcohol use: Yes    Alcohol/week: 0.6 oz    Types: 1 Cans of beer per week    Comment: occasionally  . Drug use: No  . Sexual activity: Yes    Partners: Female  Other Topics Concern  . Not on file  Social History Narrative  . Not on file     Current Outpatient Medications:  .  Amlodipine-Valsartan-HCTZ 10-160-12.5 MG TABS, Take 1 tablet by mouth daily., Disp: 90 tablet, Rfl: 1 .  aspirin EC 81 MG tablet, Take 81 mg by mouth daily., Disp: , Rfl:  .  Multiple Vitamins-Minerals (MENS 50+ MULTI VITAMIN/MIN) TABS, Take by mouth., Disp: , Rfl:  .  simvastatin (ZOCOR) 20 MG tablet, Take 1 tablet (20 mg total) by mouth daily., Disp: 90 tablet, Rfl: 1 .  brompheniramine-pseudoephedrine-DM 30-2-10 MG/5ML syrup, Take 5 mLs by mouth 4 (four) times daily as needed. (Patient not taking: Reported on 01/12/2017), Disp: 120 mL, Rfl: 0 .  triamcinolone ointment (KENALOG) 0.1 %,  Apply 1 g topically daily., Disp: , Rfl: 1  No Known Allergies   ROS  Ten systems reviewed and is negative except as mentioned in HPI   Objective  Vitals:   01/12/17 0758  BP: 120/80  Pulse: 92  Resp: 14  SpO2: 98%  Weight: 278 lb (126.1 kg)  Height: '6\' 1"'$  (1.854 m)    Body mass index is 36.68 kg/m.  Physical Exam  Constitutional: Patient appears well-developed and well-nourished. Obese  No distress.  HEENT: head atraumatic, normocephalic, pupils equal and reactive to light,  neck supple, throat within normal limits Cardiovascular: Normal rate, regular rhythm and normal heart sounds.  No murmur heard. Trace  BLE edema. Pulmonary/Chest: Effort normal and breath sounds normal. No respiratory  distress. Abdominal: Soft.  There is no tenderness. Psychiatric: Patient has a normal mood and affect. behavior is normal. Judgment and thought content normal.  Recent Results (from the past 2160 hour(s))  POCT HgB A1C     Status: Abnormal   Collection Time: 01/12/17  8:03 AM  Result Value Ref Range   Hemoglobin A1C 6.0       PHQ2/9: Depression screen [facility] 2/9 03/03/2016 03/03/2016 02/14/2015  Decreased Interest 0 0 0  Down, Depressed, Hopeless 0 0 0  PHQ - 2 Score 0 0 0     Fall Risk: Fall Risk  01/12/2017 03/03/2016 03/03/2016 02/14/2015  Falls in the past year? No No No No     Functional Status Survey: Is the patient deaf or have difficulty hearing?: No Does the patient have difficulty seeing, even when wearing glasses/contacts?: No Does the patient have difficulty concentrating, remembering, or making decisions?: No Does the patient have difficulty walking or climbing stairs?: No Does the patient have difficulty dressing or bathing?: No Does the patient have difficulty doing errands alone such as visiting a doctor's office or shopping?: No   Assessment & Plan  1. Prediabetes  - POCT HgB A1C - Insulin, random   2. Obesity, Class II, BMI 35-39.9  Discussed with the patient the risk posed by an increased BMI. Discussed importance of portion control, calorie counting and at least 150 minutes of physical activity weekly. Avoid sweet beverages and drink more water. Eat at least 6 servings of fruit and vegetables daily   3. Essential hypertension  - Amlodipine-Valsartan-HCTZ 10-160-12.5 MG TABS; Take 1 tablet by mouth daily.  Dispense: 90 tablet; Refill: 1  4. Pure hypercholesterolemia  Continue simvastatin   5. Cough  He went to employee health about 2 weeks ago, feeling better but still has a cough, we will try tessalon perles and return sooner if needed.  - benzonatate (TESSALON) 100 MG capsule; Take 1-2 capsules (100-200 mg total) by mouth 3 (three) times daily as  needed.  Dispense: 40 capsule; Refill: 0

## 2017-01-31 ENCOUNTER — Ambulatory Visit: Payer: Self-pay | Admitting: Physician Assistant

## 2017-02-07 ENCOUNTER — Encounter: Payer: Self-pay | Admitting: Physician Assistant

## 2017-02-07 ENCOUNTER — Ambulatory Visit: Payer: Self-pay | Admitting: Physician Assistant

## 2017-02-07 VITALS — BP 130/80 | HR 80 | Temp 98.5°F | Resp 16 | Ht 73.0 in | Wt 277.0 lb

## 2017-02-07 DIAGNOSIS — Z0000 Encounter for general adult medical examination without abnormal findings: Secondary | ICD-10-CM

## 2017-02-07 NOTE — Progress Notes (Signed)
   Subjective: Annual physical     Patient ID: Trevor FairyRobert L Mcfaul Jr., male    DOB: March 28, 1960, 57 y.o.   MRN: [id number]  HPI Patient presents with annual physical force no concerns.   Review of Systems Hypertension and hyperlipidemia.    Objective:   Physical Exam HEENT is unremarkable. Neck is supple without adenopathy. Lungs clear to auscultation heart is regular rate and rhythm. Examination of the abdomen shows negative HSM with normal active bowel sounds. Abdomen soft and nontender palpation. Examination of the upper and lower extremities reveals no obvious deformity. Patient had full  range of motion of the upper and lower extremities. No obvious cervical or lumbar deformity. Patient had full and equal range of motion of cervical and lumbar spine. Patient had negative straight leg test. Cranial nerves II through XII grossly intact.       Assessment & Plan: Well exam   Discussed physical findings with patient. Patient will follow-up for lab results.

## 2017-02-07 NOTE — Addendum Note (Signed)
Addended by: Catha BrowEACON, MONIQUE T on: 02/07/2017 12:33 PM   Modules accepted: Orders

## 2017-02-08 LAB — CMP12+LP+TP+TSH+6AC+PSA+CBC…
A/G RATIO: 1.3 (ref 1.2–2.2)
ALK PHOS: 70 IU/L (ref 39–117)
ALT: 31 IU/L (ref 0–44)
AST: 39 IU/L (ref 0–40)
Albumin: 4.1 g/dL (ref 3.5–5.5)
BASOS ABS: 0 10*3/uL (ref 0.0–0.2)
BASOS: 1 %
BUN / CREAT RATIO: 11 (ref 9–20)
BUN: 10 mg/dL (ref 6–24)
Bilirubin Total: 0.5 mg/dL (ref 0.0–1.2)
CALCIUM: 9.3 mg/dL (ref 8.7–10.2)
CHOL/HDL RATIO: 2.6 ratio (ref 0.0–5.0)
Chloride: 100 mmol/L (ref 96–106)
Cholesterol, Total: 150 mg/dL (ref 100–199)
Creatinine, Ser: 0.91 mg/dL (ref 0.76–1.27)
EOS (ABSOLUTE): 0.1 10*3/uL (ref 0.0–0.4)
Eos: 2 %
Estimated CHD Risk: <0.5 times avg. (ref 0.0–1.0)
Free Thyroxine Index: 1.9 (ref 1.2–4.9)
GFR calc Af Amer: 109 mL/min/{1.73_m2} (ref >59)
GFR calc non Af Amer: 95 mL/min/{1.73_m2} (ref >59)
GGT: 26 IU/L (ref 0–65)
GLOBULIN, TOTAL: 3.1 g/dL (ref 1.5–4.5)
Glucose: 97 mg/dL (ref 65–99)
HDL: 58 mg/dL (ref >39)
HEMATOCRIT: 40.2 % (ref 37.5–51.0)
Hemoglobin: 14 g/dL (ref 13.0–17.7)
IMMATURE GRANS (ABS): 0 10*3/uL (ref 0.0–0.1)
IMMATURE GRANULOCYTES: 0 %
Iron: 117 ug/dL (ref 38–169)
LDH: 192 IU/L (ref 121–224)
LDL CALC: 74 mg/dL (ref 0–99)
LYMPHS: 48 %
Lymphocytes Absolute: 2.4 10*3/uL (ref 0.7–3.1)
MCH: 30.7 pg (ref 26.6–33.0)
MCHC: 34.8 g/dL (ref 31.5–35.7)
MCV: 88 fL (ref 79–97)
Monocytes Absolute: 0.3 10*3/uL (ref 0.1–0.9)
Monocytes: 7 %
NEUTROS PCT: 42 %
Neutrophils Absolute: 2 10*3/uL (ref 1.4–7.0)
PROSTATE SPECIFIC AG, SERUM: 1.2 ng/mL (ref 0.0–4.0)
Phosphorus: 3.4 mg/dL (ref 2.5–4.5)
Platelets: 218 10*3/uL (ref 150–379)
Potassium: 3.9 mmol/L (ref 3.5–5.2)
RBC: 4.56 x10E6/uL (ref 4.14–5.80)
RDW: 14.1 % (ref 12.3–15.4)
SODIUM: 139 mmol/L (ref 134–144)
T3 Uptake Ratio: 31 % (ref 24–39)
T4, Total: 6.2 ug/dL (ref 4.5–12.0)
TRIGLYCERIDES: 91 mg/dL (ref 0–149)
TSH: 0.71 u[IU]/mL (ref 0.450–4.500)
Total Protein: 7.2 g/dL (ref 6.0–8.5)
Uric Acid: 4.6 mg/dL (ref 3.7–8.6)
VLDL CHOLESTEROL CAL: 18 mg/dL (ref 5–40)
WBC: 4.9 10*3/uL (ref 3.4–10.8)

## 2017-02-08 LAB — VITAMIN D 25 HYDROXY (VIT D DEFICIENCY, FRACTURES): VIT D 25 HYDROXY: 32.3 ng/mL (ref 30.0–100.0)

## 2017-02-08 LAB — INSULIN, RANDOM: INSULIN: 10.7 u[IU]/mL (ref 2.6–24.9)

## 2017-03-04 ENCOUNTER — Encounter: Payer: Self-pay | Admitting: Emergency Medicine

## 2017-03-04 ENCOUNTER — Other Ambulatory Visit: Payer: Self-pay

## 2017-03-04 ENCOUNTER — Emergency Department: Payer: Managed Care, Other (non HMO)

## 2017-03-04 ENCOUNTER — Emergency Department
Admission: EM | Admit: 2017-03-04 | Discharge: 2017-03-04 | Disposition: A | Discharge status: Home / Self Care | Payer: Managed Care, Other (non HMO) | Attending: Emergency Medicine | Admitting: Emergency Medicine

## 2017-03-04 DIAGNOSIS — I10 Essential (primary) hypertension: Secondary | ICD-10-CM | POA: Diagnosis not present

## 2017-03-04 DIAGNOSIS — R000 Tachycardia, unspecified: Secondary | ICD-10-CM | POA: Diagnosis not present

## 2017-03-04 DIAGNOSIS — R059 Cough, unspecified: Secondary | ICD-10-CM

## 2017-03-04 DIAGNOSIS — R6883 Chills (without fever): Secondary | ICD-10-CM | POA: Insufficient documentation

## 2017-03-04 DIAGNOSIS — R112 Nausea with vomiting, unspecified: Secondary | ICD-10-CM

## 2017-03-04 DIAGNOSIS — R197 Diarrhea, unspecified: Secondary | ICD-10-CM | POA: Diagnosis not present

## 2017-03-04 DIAGNOSIS — R05 Cough: Secondary | ICD-10-CM | POA: Diagnosis not present

## 2017-03-04 LAB — CBC
HCT: 42.2 % (ref 40.0–52.0)
Hemoglobin: 14.4 g/dL (ref 13.0–18.0)
MCH: 31 pg (ref 26.0–34.0)
MCHC: 34.1 g/dL (ref 32.0–36.0)
MCV: 91 fL (ref 80.0–100.0)
PLATELETS: 180 10*3/uL (ref 150–440)
RBC: 4.64 MIL/uL (ref 4.40–5.90)
RDW: 13.1 % (ref 11.5–14.5)
WBC: 10.2 10*3/uL (ref 3.8–10.6)

## 2017-03-04 LAB — COMPREHENSIVE METABOLIC PANEL
ALBUMIN: 4.2 g/dL (ref 3.5–5.0)
ALT: 33 U/L (ref 17–63)
AST: 45 U/L — AB (ref 15–41)
Alkaline Phosphatase: 71 U/L (ref 38–126)
Anion gap: 10 (ref 5–15)
BUN: 14 mg/dL (ref 6–20)
CHLORIDE: 101 mmol/L (ref 101–111)
CO2: 27 mmol/L (ref 22–32)
Calcium: 9.1 mg/dL (ref 8.9–10.3)
Creatinine, Ser: 1 mg/dL (ref 0.61–1.24)
GFR calc Af Amer: >60 mL/min (ref >60)
GFR calc non Af Amer: >60 mL/min (ref >60)
GLUCOSE: 110 mg/dL — AB (ref 65–99)
Potassium: 3.5 mmol/L (ref 3.5–5.1)
SODIUM: 138 mmol/L (ref 135–145)
Total Bilirubin: 0.8 mg/dL (ref 0.3–1.2)
Total Protein: 7.8 g/dL (ref 6.5–8.1)

## 2017-03-04 LAB — URINALYSIS, COMPLETE (UACMP) WITH MICROSCOPIC
BACTERIA UA: NONE SEEN
BILIRUBIN URINE: NEGATIVE
Glucose, UA: NEGATIVE mg/dL
KETONES UR: NEGATIVE mg/dL
LEUKOCYTES UA: NEGATIVE
NITRITE: NEGATIVE
Protein, ur: NEGATIVE mg/dL
Specific Gravity, Urine: 1.014 (ref 1.005–1.030)
Squamous Epithelial / LPF: NONE SEEN
pH: 5 (ref 5.0–8.0)

## 2017-03-04 LAB — INFLUENZA PANEL BY PCR (TYPE A & B)
Influenza A By PCR: NEGATIVE
Influenza B By PCR: NEGATIVE

## 2017-03-04 MED ORDER — LOPERAMIDE HCL 2 MG PO TABS: 2.0000 mg | ORAL_TABLET | Freq: Four times a day (QID) | ORAL | 0 refills | Status: AC | PRN

## 2017-03-04 MED ORDER — ONDANSETRON 4 MG PO TBDP: 4.0000 mg | ORAL_TABLET | Freq: Three times a day (TID) | ORAL | 0 refills | Status: AC | PRN

## 2017-03-04 MED ORDER — ACETAMINOPHEN 500 MG PO TABS
1000.0000 mg | ORAL_TABLET | Freq: Once | ORAL | Status: AC
  Administered 2017-03-04: 1000 mg via ORAL
  Filled 2017-03-04: qty 2

## 2017-03-04 MED ORDER — SODIUM CHLORIDE 0.9 % IV BOLUS (SEPSIS)
1000.0000 mL | Freq: Once | INTRAVENOUS | Status: AC
  Administered 2017-03-04: 1000 mL via INTRAVENOUS

## 2017-03-04 MED ORDER — IBUPROFEN 800 MG PO TABS
800.0000 mg | ORAL_TABLET | Freq: Once | ORAL | Status: AC
  Administered 2017-03-04: 800 mg via ORAL
  Filled 2017-03-04: qty 1

## 2017-03-04 MED ORDER — BENZONATATE 100 MG PO CAPS: 100.0000 mg | ORAL_CAPSULE | Freq: Four times a day (QID) | ORAL | 0 refills | Status: DC | PRN

## 2017-03-04 MED ORDER — BENZONATATE 100 MG PO CAPS
200.0000 mg | ORAL_CAPSULE | Freq: Once | ORAL | Status: AC
  Administered 2017-03-04: 200 mg via ORAL
  Filled 2017-03-04: qty 2

## 2017-03-04 MED ORDER — SODIUM CHLORIDE 0.9 % IV BOLUS (SEPSIS): 1000.0000 mL | Freq: Once | INTRAVENOUS | Status: DC

## 2017-03-04 NOTE — ED Provider Notes (Addendum)
[facility] Emergency Department Provider Note  ____________________________________________  Time seen: Approximately 4:46 PM  I have reviewed the triage vital signs and the nursing notes.   HISTORY  Chief Complaint Emesis and Nausea    HPI Trevor Wise. is a 57 y.o. male with a history of hypertension and hyperlipidemia, on day 3 of amoxicillin for dental abscess, presenting with cough, nausea and vomiting, diarrhea, shaking chills.  The patient reports that he is under treatment for dental abscess, which has significantly improved and he is currently asymptomatic after 3 days of amoxicillin.  Over the past 2-3 days, he has developed a nonproductive cough with some mild congestion, no sore throat or ear pain.  He has not had any fevers at home.  Today, the patient was at work when he developed shaking chills, EMS was called.  He had one episode of loose nonbloody stool prior to leaving his workplace, and one episode of vomiting in the ambulance.  At this time, his nausea has completely resolved.  The patient did receive his influenza vaccination this year.  Past Medical History:  Diagnosis Date  . Hyperlipidemia   . Hypertension     Patient Active Problem List   Diagnosis Date Noted  . Prediabetes 07/17/2015  . Essential hypertension 02/14/2015  . Hyperlipidemia 02/14/2015  . Obesity, Class II, BMI 35-39.9 02/14/2015    Past Surgical History:  Procedure Laterality Date  . COLONOSCOPY  11/17/2011  . KNEE SURGERY Left 2010    Current Outpatient Rx  . Order #: [id number] Class: Normal  . Order #: [id number] Class: Historical Med  . Order #: [id number] Class: Print  . Order #: [id number] Class: Print  . Order #: [id number] Class: Print  . Order #: [id number] Class: Normal    Allergies Patient has no known allergies.  Family History  Problem Relation Age of Onset  . Diabetes Mother   . Kidney disease Mother   . Cancer Mother        multiple  myeloma  . Hypertension Father   . Obesity Sister     Social History Social History   Tobacco Use  . Smoking status: Never Smoker  . Smokeless tobacco: Never Used  Substance Use Topics  . Alcohol use: Yes    Alcohol/week: 0.6 oz    Types: 1 Cans of beer per week    Comment: occasionally  . Drug use: No    Review of Systems Constitutional: No fever.  Positive shaking chills.  No lightheadedness or syncope. Eyes: No visual changes.  No eye discharge. ENT: No sore throat.  Positive congestion without rhinorrhea. Cardiovascular: Denies chest pain. Denies palpitations. Respiratory: Denies shortness of breath.  Positive cough. Gastrointestinal: No abdominal pain.  Positive single episode of loose stool, single episode of nausea and vomiting, now resolved.  No constipation. Genitourinary: Negative for dysuria. Musculoskeletal: Negative for back pain. Skin: Negative for rash. Neurological: Negative for headaches. No focal numbness, tingling or weakness.     ____________________________________________   PHYSICAL EXAM:  VITAL SIGNS: ED Triage Vitals  Enc Vitals Group     BP      Pulse      Resp      Temp      Temp src      SpO2      Weight      Height      Head Circumference      Peak Flow      Pain Score      Pain Loc  Pain Edu?      Excl. in GC?     Constitutional: Alert and oriented. Well appearing and in no acute distress. Answers questions appropriately. Eyes: Conjunctivae are normal.  EOMI. No scleral icterus. Head: Atraumatic. Nose: Mild congestion without active rhinnorhea. Mouth/Throat: Mucous membranes are moist.  No posterior pharyngeal erythema, tonsillar swelling or exudate.  The posterior palate is symmetric and the uvula is midline.  No drooling or hoarse voice.  No trismus Neck: No stridor.  Supple.  No JVD.  No meningismus. Cardiovascular: Normal rate, regular rhythm. No murmurs, rubs or gallops.  Respiratory: Normal respiratory effort.  No  accessory muscle use or retractions. Lungs CTAB.  No wheezes, rales or ronchi. Gastrointestinal: Overweight.  Soft, nontender and nondistended.  No guarding or rebound.  No peritoneal signs. Musculoskeletal: No LE edema. No ttp in the calves or palpable cords.  Negative Homan's sign. Neurologic:  A&Ox3.  Speech is clear.  Face and smile are symmetric.  EOMI.  Moves all extremities well. Skin:  Skin is warm, dry and intact. No rash noted. Psychiatric: Mood and affect are normal. Speech and behavior are normal.  Normal judgement  ____________________________________________   LABS (all labs ordered are listed, but only abnormal results are displayed)  Labs Reviewed  COMPREHENSIVE METABOLIC PANEL - Abnormal; Notable for the following components:      Result Value   Glucose, Bld 110 (*)    AST 45 (*)    All other components within normal limits  URINALYSIS, COMPLETE (UACMP) WITH MICROSCOPIC - Abnormal; Notable for the following components:   Color, Urine YELLOW (*)    APPearance CLEAR (*)    Hgb urine dipstick MODERATE (*)    All other components within normal limits  CBC  INFLUENZA PANEL BY PCR (TYPE A & B)   ____________________________________________  EKG  ED ECG REPORT I, Rockne Menghini, the attending physician, personally viewed and interpreted this ECG.   Date: 03/04/2017  EKG Time: 1703  Rate: 113  Rhythm: sinus tachycardia  Axis: normal  Intervals:none  ST&T Change: No STEMI  ____________________________________________  RADIOLOGY  Dg Chest 2 View  Result Date: 03/04/2017 CLINICAL DATA:  Cough for 1 month EXAM: CHEST  2 VIEW COMPARISON:  None. FINDINGS: There is no edema or consolidation. The heart size and pulmonary vascularity are normal. No adenopathy. No bone lesions. IMPRESSION: No edema or consolidation. Electronically Signed   By: Bretta Bang III M.D.   On: 03/04/2017 17:47     ____________________________________________   PROCEDURES  Procedure(s) performed: None  Procedures  Critical Care performed: No ____________________________________________   INITIAL IMPRESSION / ASSESSMENT AND PLAN / ED COURSE  Pertinent labs & imaging results that were available during my care of the patient were reviewed by me and considered in my medical decision making (see chart for details).  57 y.o. male with 3 days of cough, congestion, now with nausea vomiting and diarrhea.  Overall, the patient's are most consistent with influenza or flulike illness.  He may have a viral syndrome.  Will rule out pneumonia.  There is no evidence of intra-abdominal pain on his examination, and an acute intra-abdominal surgical pathology is very unlikely.  Plan to initiate symptomatic treatment, intravenous fluids, laboratory studies and chest x-ray.  Plan reevaluation for final disposition.  ----------------------------------------- 8:10 PM on 03/04/2017 -----------------------------------------  The patient's workup in the emergency department has been reassuring.  His chest x-ray does not show any evidence of pneumonia.  His influenza testing is negative.  He  does not have a UTI.  His electrolytes are also reassuring and he has no evidence of anemia.  His white blood cell count is 10.2.  After 2 L of fluid, the patient's heart rate has improved although he still has a heart rate of 107, so we will plan to give him additional fluids.  He has received an antipyretic for his temperature of 100.9.  The most likely etiology for the patient's symptoms at this time is a viral syndrome.  He is no longer nauseated and has been tolerating food and liquids without difficulty.  I have had an extensive discussion with the patient and his wife about the results of his testing today, and the plan for discharge.  Return precautions were discussed.  I have reviewed the patient's medical  chart.  ----------------------------------------- 8:50 PM on 03/04/2017 -----------------------------------------  The patient understands the results of his testing, and understands why would like to normalize his tachycardia.  He has been tolerating liquids and solids here, and prefers to rehydrate himself at home.  He understands that I prefer for him to stay for intravenous fluids as this would be more effective mechanism of rehydration, but he refuses to stay at this time.  He understands return precautions as well as follow-up instructions and will be discharged in improved condition with improved but still present tachycardia.  ____________________________________________  FINAL CLINICAL IMPRESSION(S) / ED DIAGNOSES  Final diagnoses:  Cough  Shaking chills  Nausea vomiting and diarrhea  Sinus tachycardia         NEW MEDICATIONS STARTED DURING THIS VISIT:  New Prescriptions   BENZONATATE (TESSALON PERLES) 100 MG CAPSULE    Take 1 capsule (100 mg total) by mouth every 6 (six) hours as needed for cough.   LOPERAMIDE (IMODIUM A-D) 2 MG TABLET    Take 1 tablet (2 mg total) by mouth 4 (four) times daily as needed for diarrhea or loose stools.   ONDANSETRON (ZOFRAN ODT) 4 MG DISINTEGRATING TABLET    Take 1 tablet (4 mg total) by mouth every 8 (eight) hours as needed for nausea or vomiting.      Rockne MenghiniNorman, Anne-Caroline, MD 03/04/17 2013    Rockne MenghiniNorman, Anne-Caroline, MD 03/04/17 2051

## 2017-03-04 NOTE — Discharge Instructions (Signed)
Please take a bland diet for the next 24 hours, then advance her diet as tolerated.  You may take Zofran for nausea and vomiting.  You may take Tylenol or Motrin for fevers and pain.  Return to the emergency department if you develop worsening symptoms including pain, inability to keep down fluids, fever that does not respond to Tylenol or Motrin, or any other symptoms concerning to you.

## 2017-03-04 NOTE — ED Notes (Signed)

## 2017-03-04 NOTE — ED Triage Notes (Signed)
Pt arrived via EMS c/o N/V, fever and chills.  Started about 25 minutes ago while pt was at work.  Pt states he has been around people that have been sick. Has been on abx for a mouth abscess.

## 2017-03-07 ENCOUNTER — Ambulatory Visit (INDEPENDENT_AMBULATORY_CARE_PROVIDER_SITE_OTHER): Payer: Managed Care, Other (non HMO) | Admitting: Family Medicine

## 2017-03-07 ENCOUNTER — Encounter: Payer: Self-pay | Admitting: Family Medicine

## 2017-03-07 VITALS — BP 124/72 | HR 93 | Temp 98.1°F | Resp 18 | Ht 73.0 in | Wt 278.0 lb

## 2017-03-07 DIAGNOSIS — R059 Cough, unspecified: Secondary | ICD-10-CM

## 2017-03-07 DIAGNOSIS — R0982 Postnasal drip: Secondary | ICD-10-CM

## 2017-03-07 DIAGNOSIS — R05 Cough: Secondary | ICD-10-CM

## 2017-03-07 MED ORDER — CETIRIZINE HCL 10 MG PO TABS: 10.0000 mg | ORAL_TABLET | Freq: Every day | ORAL | 0 refills | Status: AC

## 2017-03-07 MED ORDER — FLUTICASONE PROPIONATE 50 MCG/ACT NA SUSP: 2.0000 | Freq: Every day | NASAL | 3 refills | Status: DC

## 2017-03-07 MED ORDER — BENZONATATE 100 MG PO CAPS: 100.0000 mg | ORAL_CAPSULE | Freq: Three times a day (TID) | ORAL | 0 refills | Status: AC | PRN

## 2017-03-07 NOTE — Patient Instructions (Addendum)
Cough, Adult °A cough helps to clear your throat and lungs. A cough may last only 2-3 weeks (acute), or it may last longer than 8 weeks (chronic). Many different things can cause a cough. A cough may be a sign of an illness or another medical condition. °Follow these instructions at home: °· Pay attention to any changes in your cough. °· Take medicines only as told by your doctor. °? If you were prescribed an antibiotic medicine, take it as told by your doctor. Do not stop taking it even if you start to feel better. °? Talk with your doctor before you try using a cough medicine. °· Drink enough fluid to keep your pee (urine) clear or pale yellow. °· If the air is dry, use a cold steam vaporizer or humidifier in your home. °· Stay away from things that make you cough at work or at home. °· If your cough is worse at night, try using extra pillows to raise your head up higher while you sleep. °· Do not smoke, and try not to be around smoke. If you need help quitting, ask your doctor. °· Do not have caffeine. °· Do not drink alcohol. °· Rest as needed. °Contact a doctor if: °· You have new problems (symptoms). °· You cough up yellow fluid (pus). °· Your cough does not get better after 2-3 weeks, or your cough gets worse. °· Medicine does not help your cough and you are not sleeping well. °· You have pain that gets worse or pain that is not helped with medicine. °· You have a fever. °· You are losing weight and you do not know why. °· You have night sweats. °Get help right away if: °· You cough up blood. °· You have trouble breathing. °· Your heartbeat is very fast. °This information is not intended to replace advice given to you by your health care provider. Make sure you discuss any questions you have with your health care provider. °Document Released: 09/24/2010 Document Revised: 06/19/2015 Document Reviewed: 03/20/2014 °Elsevier Interactive Patient Education © 2018 [employer] ° °Cool Mist Vaporizer °A cool mist  vaporizer is a device that releases a cool mist into the air. If you have a cough or a cold, using a vaporizer may help relieve your symptoms. The mist adds moisture to the air, which may help thin your mucus and make it less sticky. When your mucus is thin and less sticky, it easier for you to breathe and to cough up secretions. °Do not use a vaporizer if you are allergic to mold. °Follow these instructions at home: °· Follow the instructions that come with the vaporizer. °· Do not use anything other than distilled water in the vaporizer. °· Do not run the vaporizer all of the time. Doing that can cause mold or bacteria to grow in the vaporizer. °· Clean the vaporizer after each time that you use it. °· Clean and dry the vaporizer well before storing it. °· Stop using the vaporizer if your breathing symptoms get worse. °This information is not intended to replace advice given to you by your health care provider. Make sure you discuss any questions you have with your health care provider. °Document Released: 10/09/2003 Document Revised: 08/01/2015 Document Reviewed: 04/12/2015 °Elsevier Interactive Patient Education © 2018 [employer] ° °

## 2017-03-07 NOTE — Progress Notes (Signed)
Name: Trevor Wise.   MRN: [id number]    DOB: 1960/11/04   Date:03/07/2017       Progress Note  Subjective  Chief Complaint  Chief Complaint  Patient presents with  . Follow-up    uncontrollable chills    HPI  Patient presents for ER  Follow up - was seen 03/04/2017 for cough, shaking chills, NVD.  He reports all symptoms have resolved except for his cough.  He states he has had a cough all winter - after an initial cold a dry cough has lingered.  He does feel post-nasal drip and sometimes gags him.  He has used OTC saline QAM and QPM.  When he lays down the cough can become a bit worse when the mucous in his throat moves, the cough doesn't wake him up at night.  He denies reflux/heartburn symptoms, is not taking ACE-I. - ER Results showed negative UA, negative  Chest xray, flu A&B were negative, CBC was normal, CMP unremarkable - no need to recheck labs today.   Patient Active Problem List   Diagnosis Date Noted  . Prediabetes 07/17/2015  . Essential hypertension 02/14/2015  . Hyperlipidemia 02/14/2015  . Obesity, Class II, BMI 35-39.9 02/14/2015    Social History   Tobacco Use  . Smoking status: Never Smoker  . Smokeless tobacco: Never Used  Substance Use Topics  . Alcohol use: Yes    Alcohol/week: 0.6 oz    Types: 1 Cans of beer per week    Comment: occasionally     Current Outpatient Medications:  .  Amlodipine-Valsartan-HCTZ 10-160-12.5 MG TABS, Take 1 tablet by mouth daily., Disp: 90 tablet, Rfl: 1 .  aspirin EC 81 MG tablet, Take 81 mg by mouth daily., Disp: , Rfl:  .  benzonatate (TESSALON PERLES) 100 MG capsule, Take 1 capsule (100 mg total) by mouth every 6 (six) hours as needed for cough., Disp: 15 capsule, Rfl: 0 .  simvastatin (ZOCOR) 20 MG tablet, Take 1 tablet (20 mg total) by mouth daily., Disp: 90 tablet, Rfl: 1 .  loperamide (IMODIUM A-D) 2 MG tablet, Take 1 tablet (2 mg total) by mouth 4 (four) times daily as needed for diarrhea or loose stools.  (Patient not taking: Reported on 03/07/2017), Disp: 12 tablet, Rfl: 0 .  ondansetron (ZOFRAN ODT) 4 MG disintegrating tablet, Take 1 tablet (4 mg total) by mouth every 8 (eight) hours as needed for nausea or vomiting. (Patient not taking: Reported on 03/07/2017), Disp: 20 tablet, Rfl: 0  No Known Allergies  ROS  Constitutional: Negative for fever or weight change. Denies fatigue Respiratory: Positive for cough; negative for shortness of breath.   Cardiovascular: Negative for chest pain or palpitations.  Gastrointestinal: Negative for abdominal pain, no bowel changes. No NVD. Musculoskeletal: Negative for gait problem or joint swelling.  Skin: Negative for rash.  Neurological: Negative for dizziness or headache.  No other specific complaints in a complete review of systems (except as listed in HPI above).  Objective  Vitals:   03/07/17 0759  BP: 124/72  Pulse: 93  Resp: 18  Temp: 98.1 F (36.7 C)  TempSrc: Oral  SpO2: 95%  Weight: 278 lb (126.1 kg)  Height: '6\' 1"'$  (1.854 m)   Body mass index is 36.68 kg/m.  Nursing Note and Vital Signs reviewed.  Physical Exam  Constitutional: Patient appears well-developed and well-nourished. Obese. No distress.  HEENT: head atraumatic, normocephalic, pupils equal and reactive to light, EOM's intact, TM's without erythema or bulging, no  maxillary or frontal sinus tenderness, neck supple without lymphadenopathy, oropharynx pink and moist without exudate Cardiovascular: Normal rate, regular rhythm, S1/S2 present.  No murmur or rub heard. No BLE edema. Pulmonary/Chest: Effort normal and breath sounds clear. No respiratory distress or retractions. Psychiatric: Patient has a normal mood and affect. behavior is normal. Judgment and thought content normal.  Results for orders placed or performed during the hospital encounter of 03/04/17 (from the past 72 hour(s))  CBC     Status: None   Collection Time: 03/04/17  4:50 PM  Result Value Ref Range    WBC 10.2 3.8 - 10.6 K/uL   RBC 4.64 4.40 - 5.90 MIL/uL   Hemoglobin 14.4 13.0 - 18.0 g/dL   HCT 42.2 40.0 - 52.0 %   MCV 91.0 80.0 - 100.0 fL   MCH 31.0 26.0 - 34.0 pg   MCHC 34.1 32.0 - 36.0 g/dL   RDW 13.1 11.5 - 14.5 %   Platelets 180 150 - 440 K/uL    Comment: Performed at [facility], [address] [place], [place] [address]  Comprehensive metabolic panel     Status: Abnormal   Collection Time: 03/04/17  4:50 PM  Result Value Ref Range   Sodium 138 135 - 145 mmol/L   Potassium 3.5 3.5 - 5.1 mmol/L   Chloride 101 101 - 111 mmol/L   CO2 27 22 - 32 mmol/L   Glucose, Bld 110 (H) 65 - 99 mg/dL   BUN 14 6 - 20 mg/dL   Creatinine, Ser 1.00 0.61 - 1.24 mg/dL   Calcium 9.1 8.9 - 10.3 mg/dL   Total Protein 7.8 6.5 - 8.1 g/dL   Albumin 4.2 3.5 - 5.0 g/dL   AST 45 (H) 15 - 41 U/L   ALT 33 17 - 63 U/L   Alkaline Phosphatase 71 38 - 126 U/L   Total Bilirubin 0.8 0.3 - 1.2 mg/dL   GFR calc non Af Amer >60 >60 mL/min   GFR calc Af Amer >60 >60 mL/min    Comment: (NOTE) The eGFR has been calculated using the CKD EPI equation. This calculation has not been validated in all clinical situations. eGFR's persistently <60 mL/min signify possible Chronic Kidney Disease.    Anion gap 10 5 - 15    Comment: Performed at [facility], [address] [place], [place] [address]  Influenza panel by PCR (type A & B)     Status: None   Collection Time: 03/04/17  4:50 PM  Result Value Ref Range   Influenza A By PCR NEGATIVE NEGATIVE   Influenza B By PCR NEGATIVE NEGATIVE    Comment: (NOTE) The Xpert Xpress Flu assay is intended as an aid in the diagnosis of  influenza and should not be used as a sole basis for treatment.  This  assay is FDA approved for nasopharyngeal swab specimens only. Nasal  washings and aspirates are unacceptable for Xpert Xpress Flu testing. Performed at [facility], [address] [place], [place] [address]   Urinalysis, Complete w  Microscopic     Status: Abnormal   Collection Time: 03/04/17  4:50 PM  Result Value Ref Range   Color, Urine YELLOW (A) YELLOW   APPearance CLEAR (A) CLEAR   Specific Gravity, Urine 1.014 1.005 - 1.030   pH 5.0 5.0 - 8.0   Glucose, UA NEGATIVE NEGATIVE mg/dL   Hgb urine dipstick MODERATE (A) NEGATIVE   Bilirubin Urine NEGATIVE NEGATIVE   Ketones, ur NEGATIVE  NEGATIVE mg/dL   Protein, ur NEGATIVE NEGATIVE mg/dL   Nitrite NEGATIVE NEGATIVE   Leukocytes, UA NEGATIVE NEGATIVE   RBC / HPF 0-5 0 - 5 RBC/hpf   WBC, UA 0-5 0 - 5 WBC/hpf   Bacteria, UA NONE SEEN NONE SEEN   Squamous Epithelial / LPF NONE SEEN NONE SEEN    Comment: Performed at [facility], [address], [place], [place] [address]    Assessment & Plan  1. Cough - cetirizine (ZYRTEC) 10 MG tablet; Take 1 tablet (10 mg total) by mouth at bedtime.  Dispense: 90 tablet; Refill: 0 - fluticasone (FLONASE) 50 MCG/ACT nasal spray; Place 2 sprays into both nostrils daily.  Dispense: 16 g; Refill: 3 - benzonatate (TESSALON PERLES) 100 MG capsule; Take 1-2 capsules (100-200 mg total) by mouth 3 (three) times daily as needed for cough.  Dispense: 30 capsule; Refill: 0  2. Post-nasal drainage - cetirizine (ZYRTEC) 10 MG tablet; Take 1 tablet (10 mg total) by mouth at bedtime.  Dispense: 90 tablet; Refill: 0 - fluticasone (FLONASE) 50 MCG/ACT nasal spray; Place 2 sprays into both nostrils daily.  Dispense: 16 g; Refill: 3 - benzonatate (TESSALON PERLES) 100 MG capsule; Take 1-2 capsules (100-200 mg total) by mouth 3 (three) times daily as needed for cough.  Dispense: 30 capsule; Refill: 0  -Red flags and when to present for emergency care or RTC including fever >101.41F, chest pain, shortness of breath, new/worsening/un-resolving symptoms, reviewed with patient at time of visit. Follow up and care instructions discussed and provided in AVS.

## 2017-05-16 ENCOUNTER — Ambulatory Visit: Payer: Self-pay | Admitting: *Deleted

## 2017-05-16 NOTE — Telephone Encounter (Signed)
Pt has numbness tingling and  sensitivity of fingers of hands x  sev weeks .Denies any neck pain or injury. Warning  Symptoms  Given and  Patient  Advised. Reason for Disposition . [1] Numbness or tingling in one or both hands AND [2] is a chronic symptom (recurrent or ongoing AND present > 4 weeks)  Answer Assessment - Initial Assessment Questions 1. SYMPTOM: "What is the main symptom you are concerned about?" (e.g., weakness, numbness)     Numbness  r  Tips  Fingers  r hand   During  Day  -  Tingling  At  Night  In both  Arms  And  Hands   2. ONSET: "When did this start?" (minutes, hours, days; while sleeping)     2  Weeks    3. LAST NORMAL: "When was the last time you were normal (no symptoms)?"      Normal  Prior  To   4. PATTERN "Does this come and go, or has it been constant since it started?"  "Is it present now?"       Symptoms  Are  Constant    5. CARDIAC SYMPTOMS: "Have you had any of the following symptoms: chest pain, difficulty breathing, palpitations?"       No 6. NEUROLOGIC SYMPTOMS: "Have you had any of the following symptoms: headache, dizziness, vision loss, double vision, changes in speech, unsteady on your feet?"      no 7. OTHER SYMPTOMS: "Do you have any other symptoms?"      no 8. PREGNANCY: "Is there any chance you are pregnant?" "When was your last menstrual period?"       n/a  Protocols used: NEUROLOGIC DEFICIT-A-AH

## 2017-05-17 ENCOUNTER — Ambulatory Visit (INDEPENDENT_AMBULATORY_CARE_PROVIDER_SITE_OTHER): Payer: Managed Care, Other (non HMO) | Admitting: Nurse Practitioner

## 2017-05-17 ENCOUNTER — Encounter: Payer: Self-pay | Admitting: Nurse Practitioner

## 2017-05-17 VITALS — BP 122/76 | HR 84 | Temp 98.2°F | Resp 16 | Ht 73.0 in | Wt 278.2 lb

## 2017-05-17 DIAGNOSIS — M79602 Pain in left arm: Secondary | ICD-10-CM

## 2017-05-17 DIAGNOSIS — R202 Paresthesia of skin: Secondary | ICD-10-CM | POA: Diagnosis not present

## 2017-05-17 DIAGNOSIS — M5412 Radiculopathy, cervical region: Secondary | ICD-10-CM

## 2017-05-17 DIAGNOSIS — M79601 Pain in right arm: Secondary | ICD-10-CM | POA: Diagnosis not present

## 2017-05-17 MED ORDER — PREDNISONE 5 MG (48) PO TBPK: ORAL_TABLET | ORAL | 0 refills | Status: AC

## 2017-05-17 NOTE — Patient Instructions (Signed)
-   take prednisone with food as directed ( will start at high dose and slowly go down) -will send referral for nerve conduction study  - rest, avoid riding for 1-2 weeks to see if symptoms resolve

## 2017-05-17 NOTE — Progress Notes (Addendum)
Name: Trevor Wise.   MRN: [id number]    DOB: 11/05/1960   Date:05/17/2017       Progress Note  Subjective  Chief Complaint  Chief Complaint  Patient presents with  . Numbness    Onset-2 weeks, intermittently-sensivity to heat and cold on right hand  . Tingling    Got a new motorcycle around this time and seems to off set this symptoms  . Arm Pain    Onset-2 weeks, will wake up with pain in his bilateral arms.     HPI  Numbness and tingling on right hand constant for 2 weeks- starts at the tips of first four fingers and sometimes goes down finger. Sensitive to hot/cold water. Wakes him up from sleep at times with pressure and fullness. Has not tried anything. Pumping fist and moving hand when it gets numb relieve it partially but not fully.  Got a new motorcycle and has been riding more.  Patient endorses bilateral arm pain occurring during sleep numb and tingling. States goes away when he rolls to other side but is constant in hand. Denies back injuries. Endorses pinch between to shoulder blades when riding motorcycle.   Patient Active Problem List   Diagnosis Date Noted  . Prediabetes 07/17/2015  . Essential hypertension 02/14/2015  . Hyperlipidemia 02/14/2015  . Obesity, Class II, BMI 35-39.9 02/14/2015    Past Medical History:  Diagnosis Date  . Hyperlipidemia   . Hypertension     Past Surgical History:  Procedure Laterality Date  . COLONOSCOPY  11/17/2011  . KNEE SURGERY Left 2010    Social History   Tobacco Use  . Smoking status: Never Smoker  . Smokeless tobacco: Never Used  Substance Use Topics  . Alcohol use: Yes    Alcohol/week: 0.6 oz    Types: 1 Cans of beer per week    Comment: occasionally     Current Outpatient Medications:  .  Amlodipine-Valsartan-HCTZ 10-160-12.5 MG TABS, Take 1 tablet by mouth daily., Disp: 90 tablet, Rfl: 1 .  aspirin EC 81 MG tablet, Take 81 mg by mouth daily., Disp: , Rfl:  .  cetirizine (ZYRTEC) 10 MG tablet, Take  1 tablet (10 mg total) by mouth at bedtime., Disp: 90 tablet, Rfl: 0 .  simvastatin (ZOCOR) 20 MG tablet, Take 1 tablet (20 mg total) by mouth daily., Disp: 90 tablet, Rfl: 1 .  benzonatate (TESSALON PERLES) 100 MG capsule, Take 1-2 capsules (100-200 mg total) by mouth 3 (three) times daily as needed for cough. (Patient not taking: Reported on 05/17/2017), Disp: 30 capsule, Rfl: 0 .  fluticasone (FLONASE) 50 MCG/ACT nasal spray, Place 2 sprays into both nostrils daily. (Patient not taking: Reported on 05/17/2017), Disp: 16 g, Rfl: 3 .  loperamide (IMODIUM A-D) 2 MG tablet, Take 1 tablet (2 mg total) by mouth 4 (four) times daily as needed for diarrhea or loose stools. (Patient not taking: Reported on 03/07/2017), Disp: 12 tablet, Rfl: 0 .  ondansetron (ZOFRAN ODT) 4 MG disintegrating tablet, Take 1 tablet (4 mg total) by mouth every 8 (eight) hours as needed for nausea or vomiting. (Patient not taking: Reported on 03/07/2017), Disp: 20 tablet, Rfl: 0  No Known Allergies  ROS   No other specific complaints in a complete review of systems (except as listed in HPI above).  Objective  Vitals:   05/17/17 1556  BP: 122/76  Pulse: 84  Resp: 16  Temp: 98.2 F (36.8 C)  TempSrc: Oral  SpO2: 96%  Weight: 278 lb 3.2 oz (126.2 kg)  Height: 6\' 1"  (1.854 m)     Body mass index is 36.7 kg/m.  Nursing Note and Vital Signs reviewed.  Physical Exam   Constitutional: Patient appears well-developed and well-nourished. Obese  No distress.  Cardiovascular: Normal rate, regular rhythm, S1/S2 present.  No murmur or rub heard. Radial and ulnar pulses 2+; cap refill 1-2 seconds Pulmonary/Chest: Effort normal and breath sounds clear. No respiratory distress or retractions. MSK: Upper extremity strength 5/5, muscle tone equal, non-tender Neuro: negative tinel and phalens testing, sensation intact. Psychiatric: Patient has a normal mood and affect. behavior is normal. Judgment and thought content  normal.  No results found for this or any previous visit (from the past 72 hour(s)).  Assessment & Plan  1. Paresthesia and pain of both upper extremities - Nerve conduction test; Future - predniSONE (STERAPRED UNI-PAK 48 TAB) 5 MG (48) TBPK tablet; Take as directed with food.  Dispense: 48 tablet; Refill: 0  2. Cervical radiculopathy - Nerve conduction test; Future - predniSONE (STERAPRED UNI-PAK 48 TAB) 5 MG (48) TBPK tablet; Take as directed with food.  Dispense: 48 tablet; Refill: 0  -Red flags and when to present for emergency care or RTC including fever >101.108F, chest pain, shortness of breath, new/worsening/un-resolving symptoms, sudden weakness reviewed with patient at time of visit. Follow up and care instructions discussed and provided in AVS.  I have reviewed this encounter including the documentation in this note and/or discussed this patient with the provider, Sharyon CableElizabeth Cherity Blickenstaff DNP AGNP-C. I am certifying that I agree with the content of this note as supervising physician. Alba CoryKrichna Sowles, MD [facility] 05/17/2017, 6:13 PM

## 2017-05-31 ENCOUNTER — Other Ambulatory Visit: Payer: Self-pay | Admitting: Family Medicine

## 2017-05-31 DIAGNOSIS — R0982 Postnasal drip: Secondary | ICD-10-CM

## 2017-05-31 DIAGNOSIS — R05 Cough: Secondary | ICD-10-CM

## 2017-05-31 DIAGNOSIS — R059 Cough, unspecified: Secondary | ICD-10-CM

## 2017-06-25 DEATH — deceased

## 2017-07-14 ENCOUNTER — Encounter: Payer: Managed Care, Other (non HMO) | Admitting: Family Medicine

## 2018-04-27 IMAGING — CR DG CHEST 2V
1 series · 2 of 2 positions shown · non-contrast
Comparison: None.

CLINICAL DATA: Cough for 1 month

EXAM:
CHEST  2 VIEW

[Series 1: dg chest 2 view · 0.14mm/px · 2 of 2 slices shown]
[im 1/2]
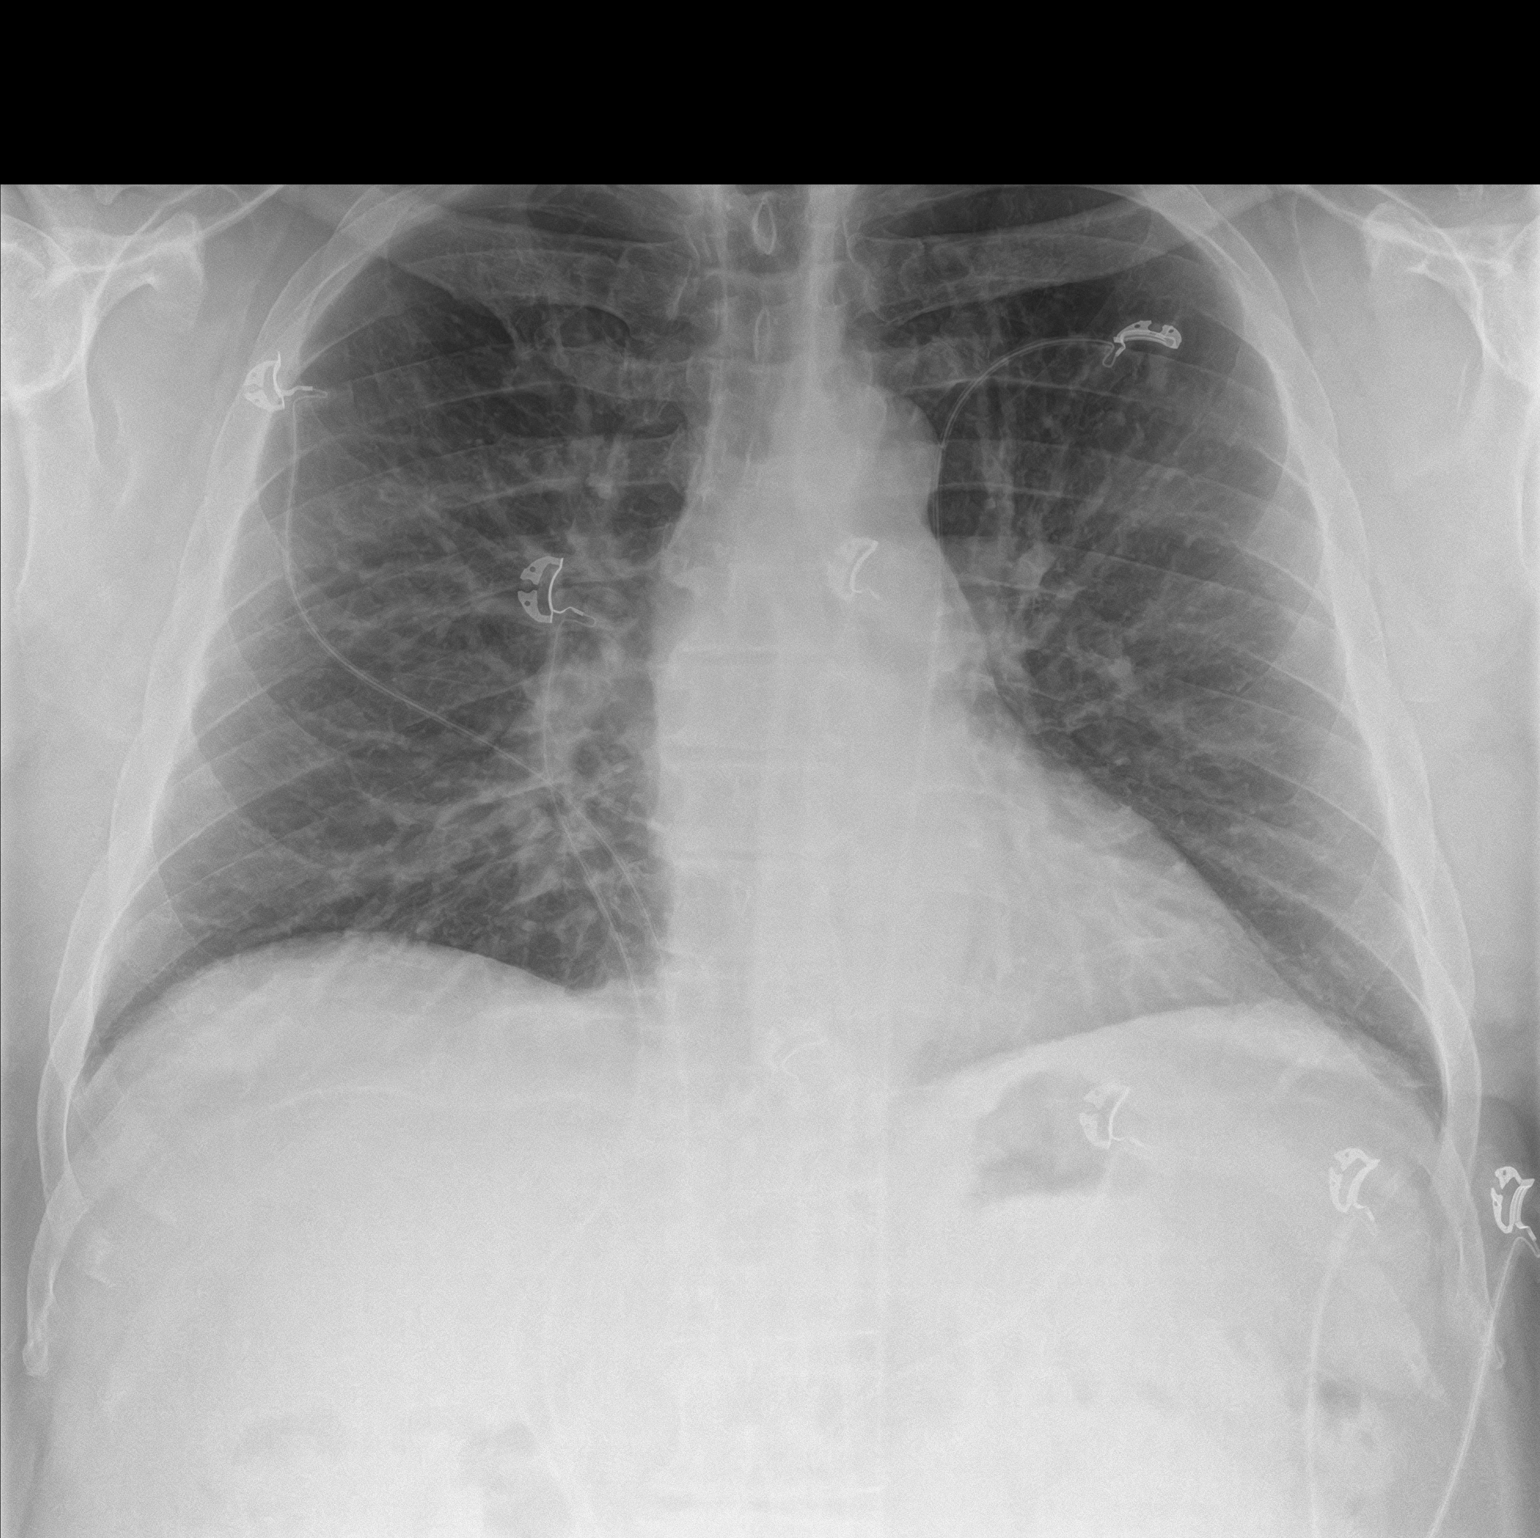
[im 2/2]
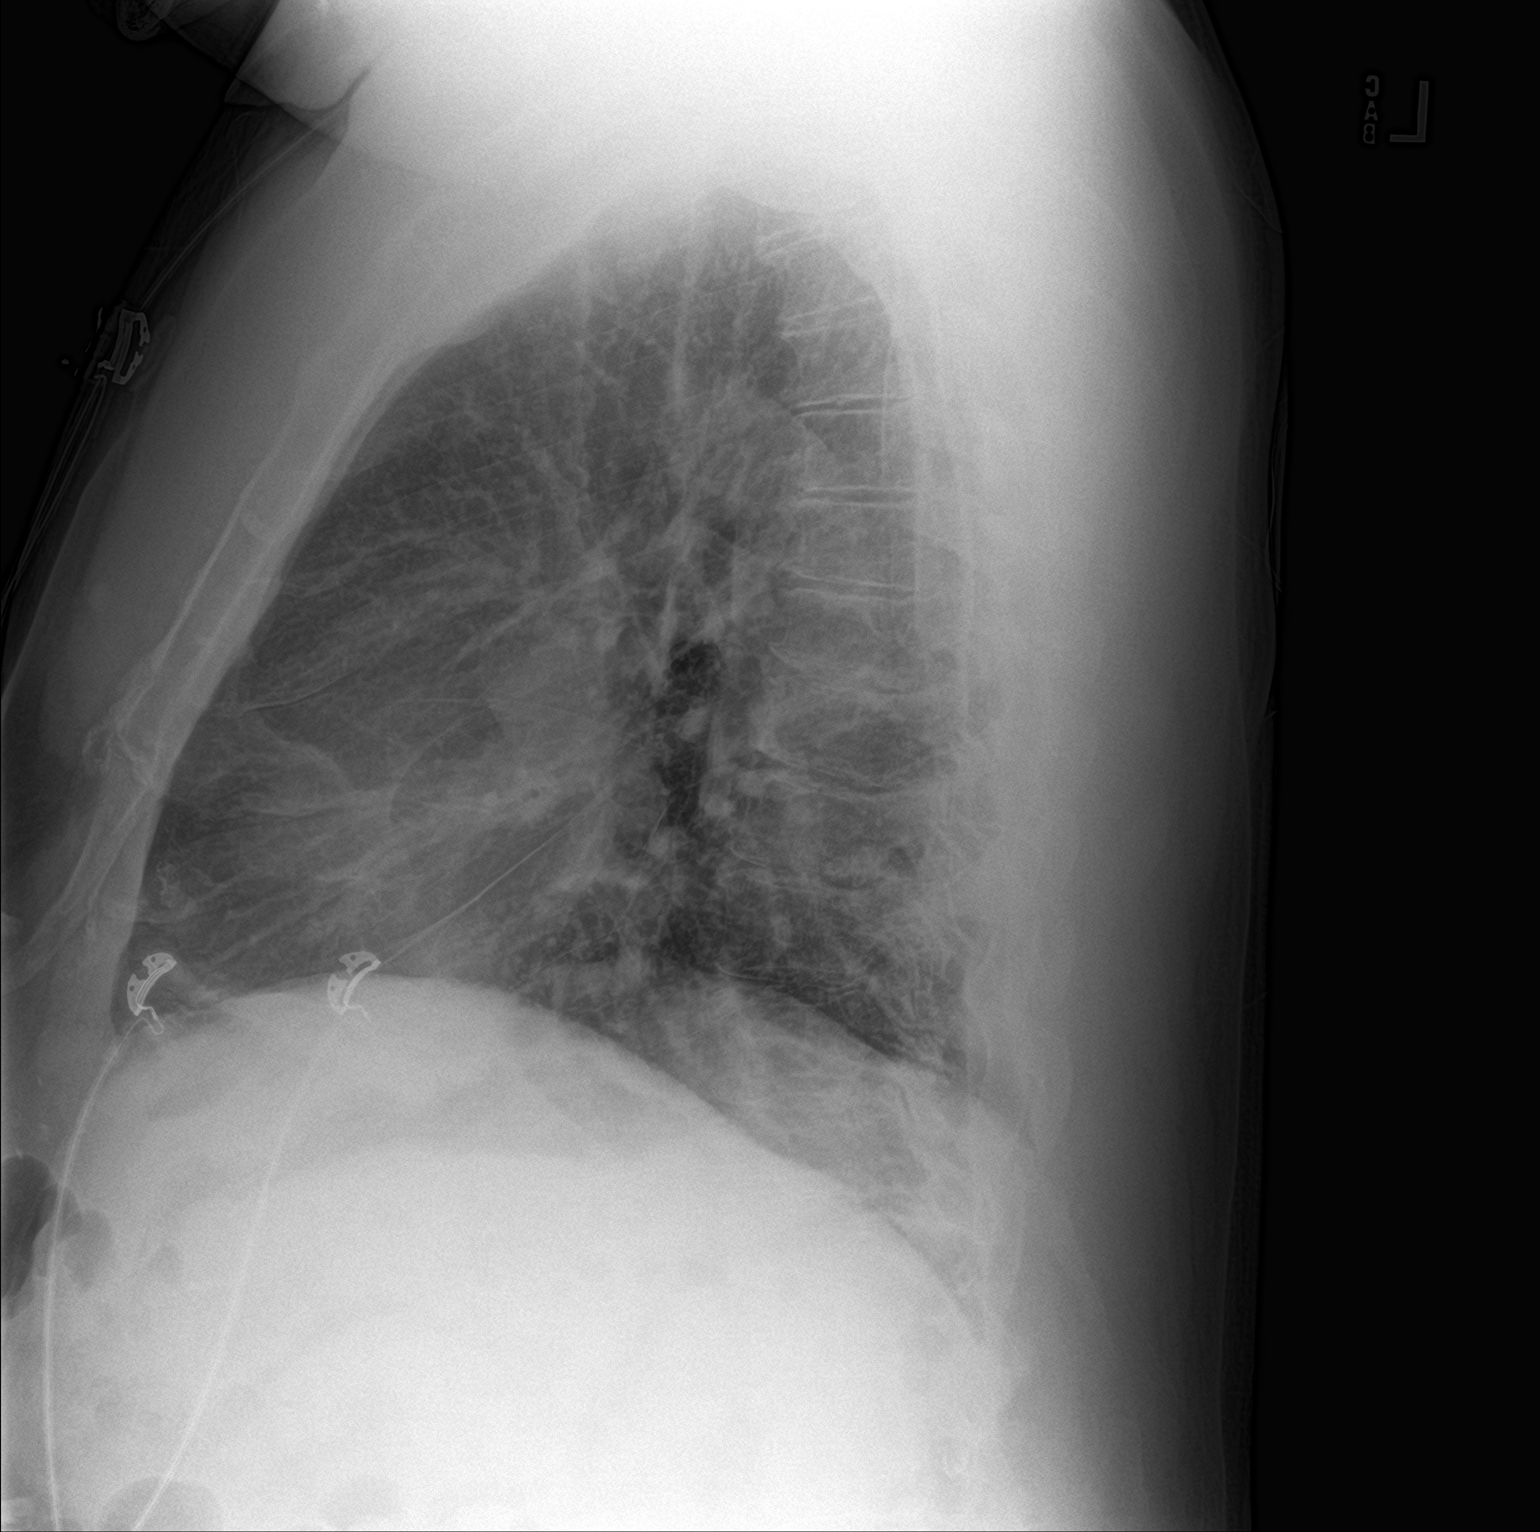

[2 of 2 positions shown; findings below may reference images not displayed]

FINDINGS: There is no edema or consolidation. The heart size and pulmonary
vascularity are normal. No adenopathy. No bone lesions.
IMPRESSION: No edema or consolidation.
